# Patient Record
Sex: Female | Born: 1960 | Race: White | Hispanic: No | Marital: Married | Smoking: Former smoker
Health system: Southern US, Community
[De-identification: ages and names within clinical notes are randomized; demographics above are authoritative.]

## PROBLEM LIST (undated history)

## (undated) DIAGNOSIS — Z8719 Personal history of other diseases of the digestive system: Secondary | ICD-10-CM

## (undated) DIAGNOSIS — G5603 Carpal tunnel syndrome, bilateral upper limbs: Secondary | ICD-10-CM

## (undated) DIAGNOSIS — R0600 Dyspnea, unspecified: Secondary | ICD-10-CM

## (undated) DIAGNOSIS — J45909 Unspecified asthma, uncomplicated: Secondary | ICD-10-CM

## (undated) DIAGNOSIS — G4730 Sleep apnea, unspecified: Secondary | ICD-10-CM

## (undated) DIAGNOSIS — G629 Polyneuropathy, unspecified: Secondary | ICD-10-CM

## (undated) DIAGNOSIS — K219 Gastro-esophageal reflux disease without esophagitis: Secondary | ICD-10-CM

## (undated) DIAGNOSIS — M1990 Unspecified osteoarthritis, unspecified site: Secondary | ICD-10-CM

## (undated) DIAGNOSIS — N63 Unspecified lump in unspecified breast: Secondary | ICD-10-CM

## (undated) DIAGNOSIS — I10 Essential (primary) hypertension: Secondary | ICD-10-CM

## (undated) HISTORY — PX: WISDOM TOOTH EXTRACTION: SHX21

## (undated) HISTORY — PX: OTHER SURGICAL HISTORY: SHX169

## (undated) HISTORY — PX: CARPAL TUNNEL RELEASE: SHX101

## (undated) HISTORY — PX: COLONOSCOPY W/ POLYPECTOMY: SHX1380

## (undated) HISTORY — PX: ABDOMINAL HYSTERECTOMY: SHX81

---

## 2010-09-18 HISTORY — PX: INCONTINENCE SURGERY: SHX676

## 2012-03-01 HISTORY — PX: BREAST EXCISIONAL BIOPSY: SUR124

## 2012-03-01 HISTORY — PX: BREAST SURGERY: SHX581

## 2014-11-02 ENCOUNTER — Ambulatory Visit: Payer: Self-pay | Admitting: Gastroenterology

## 2015-01-11 LAB — SURGICAL PATHOLOGY

## 2016-04-11 ENCOUNTER — Inpatient Hospital Stay
Admission: RE | Admit: 2016-04-11 | Discharge: 2016-04-11 | Disposition: A | Discharge status: Home / Self Care | Payer: Self-pay | Source: Ambulatory Visit | Attending: *Deleted | Admitting: *Deleted

## 2016-04-11 ENCOUNTER — Other Ambulatory Visit: Payer: Self-pay | Admitting: *Deleted

## 2016-04-11 ENCOUNTER — Other Ambulatory Visit: Payer: Self-pay | Admitting: Family Medicine

## 2016-04-11 DIAGNOSIS — Z9289 Personal history of other medical treatment: Secondary | ICD-10-CM

## 2016-04-13 ENCOUNTER — Inpatient Hospital Stay
Admission: RE | Admit: 2016-04-13 | Discharge: 2016-04-13 | Disposition: A | Discharge status: Home / Self Care | Payer: Self-pay | Source: Ambulatory Visit | Attending: *Deleted | Admitting: *Deleted

## 2016-04-13 ENCOUNTER — Other Ambulatory Visit: Payer: Self-pay | Admitting: *Deleted

## 2016-04-13 DIAGNOSIS — Z9289 Personal history of other medical treatment: Secondary | ICD-10-CM

## 2016-04-18 ENCOUNTER — Other Ambulatory Visit: Payer: Self-pay | Admitting: Obstetrics and Gynecology

## 2016-04-18 DIAGNOSIS — N63 Unspecified lump in unspecified breast: Secondary | ICD-10-CM

## 2016-05-03 ENCOUNTER — Ambulatory Visit
Admission: RE | Admit: 2016-05-03 | Discharge: 2016-05-03 | Disposition: A | Discharge status: Home / Self Care | Payer: BLUE CROSS/BLUE SHIELD | Source: Ambulatory Visit | Attending: Obstetrics and Gynecology | Admitting: Obstetrics and Gynecology

## 2016-05-03 ENCOUNTER — Other Ambulatory Visit: Payer: Self-pay | Admitting: Obstetrics and Gynecology

## 2016-05-03 DIAGNOSIS — N63 Unspecified lump in unspecified breast: Secondary | ICD-10-CM

## 2016-05-03 HISTORY — DX: Unspecified lump in unspecified breast: N63.0

## 2016-05-08 ENCOUNTER — Ambulatory Visit: Payer: BLUE CROSS/BLUE SHIELD

## 2016-07-05 ENCOUNTER — Encounter
Admission: RE | Admit: 2016-07-05 | Discharge: 2016-07-05 | Disposition: A | Discharge status: Home / Self Care | Payer: BLUE CROSS/BLUE SHIELD | Source: Ambulatory Visit | Attending: Orthopedic Surgery | Admitting: Orthopedic Surgery

## 2016-07-05 DIAGNOSIS — I10 Essential (primary) hypertension: Secondary | ICD-10-CM | POA: Insufficient documentation

## 2016-07-05 DIAGNOSIS — Z0183 Encounter for blood typing: Secondary | ICD-10-CM | POA: Insufficient documentation

## 2016-07-05 DIAGNOSIS — Z01812 Encounter for preprocedural laboratory examination: Secondary | ICD-10-CM | POA: Diagnosis not present

## 2016-07-05 DIAGNOSIS — M1611 Unilateral primary osteoarthritis, right hip: Secondary | ICD-10-CM | POA: Insufficient documentation

## 2016-07-05 DIAGNOSIS — Z01818 Encounter for other preprocedural examination: Secondary | ICD-10-CM | POA: Diagnosis present

## 2016-07-05 DIAGNOSIS — Z888 Allergy status to other drugs, medicaments and biological substances status: Secondary | ICD-10-CM | POA: Insufficient documentation

## 2016-07-05 HISTORY — DX: Essential (primary) hypertension: I10

## 2016-07-05 HISTORY — DX: Unspecified osteoarthritis, unspecified site: M19.90

## 2016-07-05 HISTORY — DX: Dyspnea, unspecified: R06.00

## 2016-07-05 HISTORY — DX: Polyneuropathy, unspecified: G62.9

## 2016-07-05 HISTORY — DX: Carpal tunnel syndrome, bilateral upper limbs: G56.03

## 2016-07-05 HISTORY — DX: Personal history of other diseases of the digestive system: Z87.19

## 2016-07-05 HISTORY — DX: Gastro-esophageal reflux disease without esophagitis: K21.9

## 2016-07-05 LAB — BASIC METABOLIC PANEL
Anion gap: 8 (ref 5–15)
BUN: 12 mg/dL (ref 6–20)
CHLORIDE: 103 mmol/L (ref 101–111)
CO2: 29 mmol/L (ref 22–32)
CREATININE: 0.89 mg/dL (ref 0.44–1.00)
Calcium: 9.2 mg/dL (ref 8.9–10.3)
GFR calc Af Amer: >60 mL/min (ref >60)
GLUCOSE: 93 mg/dL (ref 65–99)
POTASSIUM: 3.8 mmol/L (ref 3.5–5.1)
Sodium: 140 mmol/L (ref 135–145)

## 2016-07-05 LAB — PROTIME-INR
INR: 0.92
Prothrombin Time: 12.3 seconds (ref 11.4–15.2)

## 2016-07-05 LAB — URINALYSIS COMPLETE WITH MICROSCOPIC (ARMC ONLY)
BILIRUBIN URINE: NEGATIVE
Bacteria, UA: NONE SEEN
GLUCOSE, UA: NEGATIVE mg/dL
Hgb urine dipstick: NEGATIVE
Ketones, ur: NEGATIVE mg/dL
Leukocytes, UA: NEGATIVE
Nitrite: NEGATIVE
Protein, ur: NEGATIVE mg/dL
Specific Gravity, Urine: 1.009 (ref 1.005–1.030)
pH: 6 (ref 5.0–8.0)

## 2016-07-05 LAB — CBC
HCT: 41 % (ref 35.0–47.0)
Hemoglobin: 14.4 g/dL (ref 12.0–16.0)
MCH: 31.2 pg (ref 26.0–34.0)
MCHC: 35 g/dL (ref 32.0–36.0)
MCV: 89.2 fL (ref 80.0–100.0)
PLATELETS: 212 10*3/uL (ref 150–440)
RBC: 4.6 MIL/uL (ref 3.80–5.20)
RDW: 13.1 % (ref 11.5–14.5)
WBC: 5.7 10*3/uL (ref 3.6–11.0)

## 2016-07-05 LAB — TYPE AND SCREEN
ABO/RH(D): O NEG
ANTIBODY SCREEN: NEGATIVE

## 2016-07-05 LAB — SEDIMENTATION RATE: SED RATE: 15 mm/h (ref 0–30)

## 2016-07-05 LAB — SURGICAL PCR SCREEN
MRSA, PCR: NEGATIVE
Staphylococcus aureus: NEGATIVE

## 2016-07-05 LAB — APTT: aPTT: 37 seconds — ABNORMAL HIGH (ref 24–36)

## 2016-07-05 NOTE — Patient Instructions (Signed)
  Your procedure is scheduled on: July 18, 2016 (Tuesday) Report to [facility] Day [facility] 2nd floor [facility] To find out your arrival time please call [phone] between 1PM - 3PM on July 17, 2016 (Monday)   Remember: Instructions that are not followed completely may result in serious medical risk, up to and including death, or upon the discretion of your surgeon and anesthesiologist your surgery may need to be rescheduled.    _x___ 1. Do not eat food or drink liquids after midnight. No gum chewing or hard candies.     _x     __ 2. No Alcohol for 24 hours before or after surgery.   __x__3. No Smoking for 24 prior to surgery.   ____  4. Bring all medications with you on the day of surgery if instructed.    __x__ 5. Notify your doctor if there is any change in your medical condition     (cold, fever, infections).     Do not wear jewelry, make-up, hairpins, clips or nail polish.  Do not wear lotions, powders, or perfumes. You may wear deodorant.  Do not shave 48 hours prior to surgery. Men may shave face and neck.  Do not bring valuables to the hospital.    [facility] is not responsible for any belongings or valuables.               Contacts, dentures or bridgework may not be worn into surgery.  Leave your suitcase in the car. After surgery it may be brought to your room.  For patients admitted to the hospital, discharge time is determined by your treatment team.   Patients discharged the day of surgery will not be allowed to drive home.    Please read over the following fact sheets that you were given:   [facility] Preparing for Surgery and or MRSA Information   _x___ Take these medicines the morning of surgery with A SIP OF WATER:    1. Omeprazole  2.  3.  4.  5.  6.  ____Fleets enema or Magnesium Citrate as directed.   _x___ Use CHG Soap or sage wipes as directed on instruction sheet   ____ Use inhalers on the day of surgery and bring to hospital day of  surgery  ____ Stop metformin 2 days prior to surgery    ____ Take 1/2 of usual insulin dose the night before surgery and none on the morning of           surgery.   _x___ Stop aspirin or coumadin, or plavix (NO ASPIRIN)  x__ Stop Anti-inflammatories such as Advil, Aleve, Ibuprofen, Motrin, Naproxen,          Naprosyn, Goodies powders or aspirin products.  (STOP MELOXICAM ONE WEEK PRIOR TO SURGERY) Ok to take Tylenol.   ____ Stop supplements until after surgery.    ____ Bring C-Pap to the hospital.

## 2016-07-06 LAB — URINE CULTURE

## 2016-07-18 ENCOUNTER — Encounter: Payer: Self-pay | Admitting: *Deleted

## 2016-07-18 ENCOUNTER — Inpatient Hospital Stay: Payer: BLUE CROSS/BLUE SHIELD

## 2016-07-18 ENCOUNTER — Inpatient Hospital Stay
Admission: RE | Admit: 2016-07-18 | Discharge: 2016-07-21 | DRG: 470 | Disposition: A | Discharge status: Home Health | Payer: BLUE CROSS/BLUE SHIELD | Source: Ambulatory Visit | Attending: Orthopedic Surgery | Admitting: Orthopedic Surgery

## 2016-07-18 ENCOUNTER — Inpatient Hospital Stay: Payer: BLUE CROSS/BLUE SHIELD | Admitting: Anesthesiology

## 2016-07-18 ENCOUNTER — Encounter
Admission: RE | Disposition: A | Discharge status: Home Health | Payer: Self-pay | Source: Ambulatory Visit | Attending: Orthopedic Surgery

## 2016-07-18 DIAGNOSIS — M25551 Pain in right hip: Secondary | ICD-10-CM

## 2016-07-18 DIAGNOSIS — R262 Difficulty in walking, not elsewhere classified: Secondary | ICD-10-CM

## 2016-07-18 DIAGNOSIS — G8918 Other acute postprocedural pain: Secondary | ICD-10-CM

## 2016-07-18 DIAGNOSIS — M6281 Muscle weakness (generalized): Secondary | ICD-10-CM

## 2016-07-18 DIAGNOSIS — K219 Gastro-esophageal reflux disease without esophagitis: Secondary | ICD-10-CM | POA: Diagnosis present

## 2016-07-18 DIAGNOSIS — Z79899 Other long term (current) drug therapy: Secondary | ICD-10-CM

## 2016-07-18 DIAGNOSIS — I10 Essential (primary) hypertension: Secondary | ICD-10-CM | POA: Diagnosis present

## 2016-07-18 DIAGNOSIS — K449 Diaphragmatic hernia without obstruction or gangrene: Secondary | ICD-10-CM | POA: Diagnosis present

## 2016-07-18 DIAGNOSIS — Z888 Allergy status to other drugs, medicaments and biological substances status: Secondary | ICD-10-CM

## 2016-07-18 DIAGNOSIS — Z419 Encounter for procedure for purposes other than remedying health state, unspecified: Secondary | ICD-10-CM

## 2016-07-18 DIAGNOSIS — M1611 Unilateral primary osteoarthritis, right hip: Secondary | ICD-10-CM | POA: Diagnosis present

## 2016-07-18 HISTORY — PX: TOTAL HIP ARTHROPLASTY: SHX124

## 2016-07-18 LAB — CREATININE, SERUM: Creatinine, Ser: 0.81 mg/dL (ref 0.44–1.00)

## 2016-07-18 LAB — CBC
HEMATOCRIT: 40.2 % (ref 35.0–47.0)
Hemoglobin: 13.7 g/dL (ref 12.0–16.0)
MCH: 30.7 pg (ref 26.0–34.0)
MCHC: 34.2 g/dL (ref 32.0–36.0)
MCV: 89.7 fL (ref 80.0–100.0)
PLATELETS: 208 10*3/uL (ref 150–440)
RBC: 4.48 MIL/uL (ref 3.80–5.20)
RDW: 13.1 % (ref 11.5–14.5)
WBC: 13.8 10*3/uL — AB (ref 3.6–11.0)

## 2016-07-18 LAB — ABO/RH: ABO/RH(D): O NEG

## 2016-07-18 SURGERY — ARTHROPLASTY, HIP, TOTAL, ANTERIOR APPROACH
Anesthesia: Spinal | Site: Hip | Laterality: Right | Wound class: Clean

## 2016-07-18 MED ORDER — HYDROMORPHONE HCL 1 MG/ML IJ SOLN
0.2500 mg | INTRAMUSCULAR | Status: DC | PRN
  Administered 2016-07-18 (×4): 0.5 mg via INTRAVENOUS

## 2016-07-18 MED ORDER — FENTANYL CITRATE (PF) 100 MCG/2ML IJ SOLN
INTRAMUSCULAR | Status: AC
  Filled 2016-07-18: qty 2

## 2016-07-18 MED ORDER — ONDANSETRON HCL 4 MG/2ML IJ SOLN
INTRAMUSCULAR | Status: AC
  Filled 2016-07-18: qty 2

## 2016-07-18 MED ORDER — MELOXICAM 7.5 MG PO TABS
15.0000 mg | ORAL_TABLET | Freq: Every day | ORAL | Status: DC
  Administered 2016-07-19 – 2016-07-21 (×3): 15 mg via ORAL
  Filled 2016-07-18 (×4): qty 2

## 2016-07-18 MED ORDER — METOCLOPRAMIDE HCL 5 MG/ML IJ SOLN
5.0000 mg | Freq: Three times a day (TID) | INTRAMUSCULAR | Status: DC | PRN
  Administered 2016-07-19: 10 mg via INTRAVENOUS
  Filled 2016-07-18 (×2): qty 2

## 2016-07-18 MED ORDER — CEFAZOLIN SODIUM-DEXTROSE 2-4 GM/100ML-% IV SOLN
2.0000 g | Freq: Once | INTRAVENOUS | Status: AC
  Administered 2016-07-18: 2 g via INTRAVENOUS

## 2016-07-18 MED ORDER — DEXTROSE 5 % IV SOLN
3.0000 g | Freq: Four times a day (QID) | INTRAVENOUS | Status: AC
  Administered 2016-07-18 – 2016-07-19 (×2): 3 g via INTRAVENOUS
  Filled 2016-07-18 (×4): qty 3000

## 2016-07-18 MED ORDER — FENTANYL CITRATE (PF) 100 MCG/2ML IJ SOLN
INTRAMUSCULAR | Status: DC | PRN
  Administered 2016-07-18 (×2): 25 ug via INTRAVENOUS
  Administered 2016-07-18 (×3): 50 ug via INTRAVENOUS

## 2016-07-18 MED ORDER — MORPHINE SULFATE (PF) 10 MG/ML IV SOLN
INTRAVENOUS | Status: AC
  Administered 2016-07-18: 2 mg via INTRAMUSCULAR
  Filled 2016-07-18: qty 1

## 2016-07-18 MED ORDER — ENOXAPARIN SODIUM 40 MG/0.4ML ~~LOC~~ SOLN
40.0000 mg | SUBCUTANEOUS | Status: DC
  Administered 2016-07-19 – 2016-07-21 (×3): 40 mg via SUBCUTANEOUS
  Filled 2016-07-18 (×3): qty 0.4

## 2016-07-18 MED ORDER — NEOMYCIN-POLYMYXIN B GU 40-200000 IR SOLN
Status: DC | PRN
  Administered 2016-07-18: 4 mL

## 2016-07-18 MED ORDER — ACETAMINOPHEN 650 MG RE SUPP: 650.0000 mg | Freq: Four times a day (QID) | RECTAL | Status: DC | PRN

## 2016-07-18 MED ORDER — GABAPENTIN 300 MG PO CAPS
600.0000 mg | ORAL_CAPSULE | Freq: Every day | ORAL | Status: DC
  Administered 2016-07-18 – 2016-07-20 (×3): 600 mg via ORAL
  Filled 2016-07-18 (×3): qty 2

## 2016-07-18 MED ORDER — LACTATED RINGERS IV SOLN
INTRAVENOUS | Status: DC
  Administered 2016-07-18 (×2): via INTRAVENOUS

## 2016-07-18 MED ORDER — ACETAMINOPHEN 325 MG PO TABS
650.0000 mg | ORAL_TABLET | Freq: Four times a day (QID) | ORAL | Status: DC | PRN
  Administered 2016-07-20 – 2016-07-21 (×2): 650 mg via ORAL
  Filled 2016-07-18: qty 2

## 2016-07-18 MED ORDER — MORPHINE SULFATE (PF) 2 MG/ML IV SOLN
2.0000 mg | INTRAVENOUS | Status: DC | PRN
  Administered 2016-07-18 (×2): 2 mg via INTRAVENOUS
  Filled 2016-07-18 (×2): qty 1

## 2016-07-18 MED ORDER — BUPIVACAINE-EPINEPHRINE (PF) 0.25% -1:200000 IJ SOLN
INTRAMUSCULAR | Status: AC
  Filled 2016-07-18: qty 30

## 2016-07-18 MED ORDER — TIZANIDINE HCL 4 MG PO TABS
4.0000 mg | ORAL_TABLET | Freq: Every day | ORAL | Status: DC
  Administered 2016-07-18 – 2016-07-20 (×3): 4 mg via ORAL
  Filled 2016-07-18 (×3): qty 1

## 2016-07-18 MED ORDER — OXYCODONE HCL 5 MG PO TABS
5.0000 mg | ORAL_TABLET | ORAL | Status: DC | PRN
  Administered 2016-07-18: 5 mg via ORAL
  Administered 2016-07-18 – 2016-07-19 (×3): 10 mg via ORAL
  Administered 2016-07-19: 5 mg via ORAL
  Filled 2016-07-18 (×2): qty 1
  Filled 2016-07-18 (×3): qty 2

## 2016-07-18 MED ORDER — ONDANSETRON HCL 4 MG/2ML IJ SOLN
4.0000 mg | Freq: Four times a day (QID) | INTRAMUSCULAR | Status: DC | PRN
  Administered 2016-07-18: 4 mg via INTRAVENOUS
  Filled 2016-07-18: qty 2

## 2016-07-18 MED ORDER — ONDANSETRON HCL 4 MG PO TABS: 4.0000 mg | ORAL_TABLET | Freq: Four times a day (QID) | ORAL | Status: DC | PRN

## 2016-07-18 MED ORDER — PROPOFOL 500 MG/50ML IV EMUL
INTRAVENOUS | Status: DC | PRN
  Administered 2016-07-18: 65 ug/kg/min via INTRAVENOUS

## 2016-07-18 MED ORDER — ALUM & MAG HYDROXIDE-SIMETH 200-200-20 MG/5ML PO SUSP
30.0000 mL | ORAL | Status: DC | PRN
  Administered 2016-07-18: 30 mL via ORAL
  Filled 2016-07-18: qty 30

## 2016-07-18 MED ORDER — MAGNESIUM CITRATE PO SOLN
1.0000 | Freq: Once | ORAL | Status: DC | PRN
  Filled 2016-07-18: qty 296

## 2016-07-18 MED ORDER — ZOLPIDEM TARTRATE 5 MG PO TABS: 5.0000 mg | ORAL_TABLET | Freq: Every evening | ORAL | Status: DC | PRN

## 2016-07-18 MED ORDER — DEXTROSE 5 % IV SOLN
500.0000 mg | Freq: Four times a day (QID) | INTRAVENOUS | Status: DC | PRN
  Filled 2016-07-18: qty 5

## 2016-07-18 MED ORDER — IRBESARTAN 150 MG PO TABS
150.0000 mg | ORAL_TABLET | Freq: Every day | ORAL | Status: DC
  Administered 2016-07-19 – 2016-07-21 (×3): 150 mg via ORAL
  Filled 2016-07-18 (×3): qty 1

## 2016-07-18 MED ORDER — SODIUM CHLORIDE 0.9 % IV SOLN
INTRAVENOUS | Status: DC | PRN
  Administered 2016-07-18: 30 ug/min via INTRAVENOUS

## 2016-07-18 MED ORDER — HYDROCHLOROTHIAZIDE 25 MG PO TABS
25.0000 mg | ORAL_TABLET | Freq: Every day | ORAL | Status: DC
  Administered 2016-07-19 – 2016-07-21 (×3): 25 mg via ORAL
  Filled 2016-07-18 (×3): qty 1

## 2016-07-18 MED ORDER — PROPOFOL 10 MG/ML IV BOLUS
INTRAVENOUS | Status: DC | PRN
  Administered 2016-07-18: 20 mg via INTRAVENOUS
  Administered 2016-07-18 (×3): 10 mg via INTRAVENOUS

## 2016-07-18 MED ORDER — METOCLOPRAMIDE HCL 10 MG PO TABS
5.0000 mg | ORAL_TABLET | Freq: Three times a day (TID) | ORAL | Status: DC | PRN
Start: 2016-07-18 — End: 2016-07-21
  Filled 2016-07-18: qty 2

## 2016-07-18 MED ORDER — BISACODYL 10 MG RE SUPP: 10.0000 mg | Freq: Every day | RECTAL | Status: DC | PRN

## 2016-07-18 MED ORDER — SODIUM CHLORIDE 0.9 % IV SOLN
INTRAVENOUS | Status: DC
  Administered 2016-07-18 – 2016-07-19 (×3): via INTRAVENOUS

## 2016-07-18 MED ORDER — PHENOL 1.4 % MT LIQD
1.0000 | OROMUCOSAL | Status: DC | PRN
  Filled 2016-07-18: qty 177

## 2016-07-18 MED ORDER — ACETAMINOPHEN 10 MG/ML IV SOLN
INTRAVENOUS | Status: DC | PRN
  Administered 2016-07-18: 1000 mg via INTRAVENOUS

## 2016-07-18 MED ORDER — HYDROMORPHONE HCL 1 MG/ML IJ SOLN
INTRAMUSCULAR | Status: AC
  Filled 2016-07-18: qty 1

## 2016-07-18 MED ORDER — FENTANYL CITRATE (PF) 100 MCG/2ML IJ SOLN
25.0000 ug | INTRAMUSCULAR | Status: AC | PRN
  Administered 2016-07-18 (×6): 25 ug via INTRAVENOUS

## 2016-07-18 MED ORDER — MAGNESIUM HYDROXIDE 400 MG/5ML PO SUSP: 30.0000 mL | Freq: Every day | ORAL | Status: DC | PRN

## 2016-07-18 MED ORDER — BUPIVACAINE-EPINEPHRINE 0.25% -1:200000 IJ SOLN
INTRAMUSCULAR | Status: DC | PRN
  Administered 2016-07-18: 30 mL

## 2016-07-18 MED ORDER — METHOCARBAMOL 500 MG PO TABS: 500.0000 mg | ORAL_TABLET | Freq: Four times a day (QID) | ORAL | Status: DC | PRN

## 2016-07-18 MED ORDER — BUPIVACAINE IN DEXTROSE 0.75-8.25 % IT SOLN
INTRATHECAL | Status: DC | PRN
  Administered 2016-07-18: 1.6 mL via INTRATHECAL

## 2016-07-18 MED ORDER — ACETAMINOPHEN 10 MG/ML IV SOLN
INTRAVENOUS | Status: AC
  Filled 2016-07-18: qty 100

## 2016-07-18 MED ORDER — VALSARTAN-HYDROCHLOROTHIAZIDE 160-25 MG PO TABS: 1.0000 | ORAL_TABLET | Freq: Every day | ORAL | Status: DC

## 2016-07-18 MED ORDER — ONDANSETRON HCL 4 MG/2ML IJ SOLN
4.0000 mg | Freq: Once | INTRAMUSCULAR | Status: AC | PRN
  Administered 2016-07-18: 4 mg via INTRAVENOUS

## 2016-07-18 MED ORDER — NEOMYCIN-POLYMYXIN B GU 40-200000 IR SOLN
Status: AC
  Filled 2016-07-18: qty 4

## 2016-07-18 MED ORDER — FLUTICASONE PROPIONATE 50 MCG/ACT NA SUSP
2.0000 | Freq: Every day | NASAL | Status: DC
  Administered 2016-07-20 – 2016-07-21 (×2): 2 via NASAL
  Filled 2016-07-18: qty 16

## 2016-07-18 MED ORDER — MORPHINE SULFATE (PF) 2 MG/ML IV SOLN
1.0000 mg | INTRAVENOUS | Status: DC | PRN
  Administered 2016-07-18 (×5): 2 mg via INTRAVENOUS

## 2016-07-18 MED ORDER — SODIUM CHLORIDE 0.9 % IV SOLN
1500.0000 mg | INTRAVENOUS | Status: AC
  Administered 2016-07-18: 1500 mg via INTRAVENOUS
  Filled 2016-07-18 (×2): qty 15

## 2016-07-18 MED ORDER — PANTOPRAZOLE SODIUM 40 MG PO TBEC
40.0000 mg | DELAYED_RELEASE_TABLET | Freq: Every day | ORAL | Status: DC
  Administered 2016-07-19 – 2016-07-21 (×3): 40 mg via ORAL
  Filled 2016-07-18 (×3): qty 1

## 2016-07-18 MED ORDER — ONDANSETRON HCL 4 MG/2ML IJ SOLN
INTRAMUSCULAR | Status: DC | PRN
  Administered 2016-07-18: 4 mg via INTRAVENOUS

## 2016-07-18 MED ORDER — PHENYLEPHRINE HCL 10 MG/ML IJ SOLN
INTRAMUSCULAR | Status: DC | PRN
  Administered 2016-07-18: 100 ug via INTRAVENOUS
  Administered 2016-07-18: 150 ug via INTRAVENOUS
  Administered 2016-07-18 (×3): 100 ug via INTRAVENOUS
  Administered 2016-07-18: 50 ug via INTRAVENOUS

## 2016-07-18 MED ORDER — DOCUSATE SODIUM 100 MG PO CAPS
100.0000 mg | ORAL_CAPSULE | Freq: Two times a day (BID) | ORAL | Status: DC
  Administered 2016-07-18 – 2016-07-21 (×6): 100 mg via ORAL
  Filled 2016-07-18 (×6): qty 1

## 2016-07-18 MED ORDER — MENTHOL 3 MG MT LOZG
1.0000 | LOZENGE | OROMUCOSAL | Status: DC | PRN
  Filled 2016-07-18: qty 9

## 2016-07-18 SURGICAL SUPPLY — 54 items
BLADE SAW SAG 18.5X105 (BLADE) ×3 IMPLANT
BNDG COHESIVE 6X5 TAN STRL LF (GAUZE/BANDAGES/DRESSINGS) ×6 IMPLANT
CANISTER SUCT 1200ML W/VALVE (MISCELLANEOUS) ×3 IMPLANT
CAPT HIP TOTAL 3 ×3 IMPLANT
CATH FOL LEG HOLDER (MISCELLANEOUS) ×3 IMPLANT
CATH TRAY METER 16FR LF (MISCELLANEOUS) ×3 IMPLANT
CHLORAPREP W/TINT 26ML (MISCELLANEOUS) ×3 IMPLANT
DRAPE C-ARM XRAY 36X54 (DRAPES) ×3 IMPLANT
DRAPE INCISE IOBAN 66X60 STRL (DRAPES) IMPLANT
DRAPE POUCH INSTRU U-SHP 10X18 (DRAPES) ×3 IMPLANT
DRAPE SHEET LG 3/4 BI-LAMINATE (DRAPES) ×9 IMPLANT
DRAPE STERI IOBAN 125X83 (DRAPES) ×3 IMPLANT
DRAPE TABLE BACK 80X90 (DRAPES) ×3 IMPLANT
DRAPE WOUND VAC 10X15X1CM (MISCELLANEOUS) ×3 IMPLANT
DRSG OPSITE POSTOP 4X8 (GAUZE/BANDAGES/DRESSINGS) IMPLANT
ELECT BLADE 6.5 EXT (BLADE) ×3 IMPLANT
GAUZE SPONGE 4X4 12PLY STRL (GAUZE/BANDAGES/DRESSINGS) IMPLANT
GLOVE BIO SURGEON STRL SZ 6.5 (GLOVE) ×2 IMPLANT
GLOVE BIO SURGEON STRL SZ7.5 (GLOVE) ×9 IMPLANT
GLOVE BIO SURGEONS STRL SZ 6.5 (GLOVE) ×1
GLOVE BIOGEL PI IND STRL 7.0 (GLOVE) ×1 IMPLANT
GLOVE BIOGEL PI IND STRL 9 (GLOVE) ×1 IMPLANT
GLOVE BIOGEL PI INDICATOR 7.0 (GLOVE) ×2
GLOVE BIOGEL PI INDICATOR 9 (GLOVE) ×2
GLOVE SURG SYN 9.0  PF PI (GLOVE) ×4
GLOVE SURG SYN 9.0 PF PI (GLOVE) ×2 IMPLANT
GOWN SRG 2XL LVL 4 RGLN SLV (GOWNS) ×1 IMPLANT
GOWN STRL NON-REIN 2XL LVL4 (GOWNS) ×2
GOWN STRL REUS W/ TWL LRG LVL3 (GOWN DISPOSABLE) ×1 IMPLANT
GOWN STRL REUS W/TWL LRG LVL3 (GOWN DISPOSABLE) ×2
HEMOVAC 400CC 10FR (MISCELLANEOUS) ×3 IMPLANT
HOOD PEEL AWAY FLYTE STAYCOOL (MISCELLANEOUS) ×3 IMPLANT
KIT PREVENA INCISION MGT20CM45 (CANNISTER) ×3 IMPLANT
MAT BLUE FLOOR 46X72 FLO (MISCELLANEOUS) ×3 IMPLANT
NDL SAFETY 18GX1.5 (NEEDLE) ×3 IMPLANT
NEEDLE SPNL 18GX3.5 QUINCKE PK (NEEDLE) ×3 IMPLANT
NS IRRIG 1000ML POUR BTL (IV SOLUTION) ×3 IMPLANT
PACK HIP COMPR (MISCELLANEOUS) ×3 IMPLANT
SOL PREP PVP 2OZ (MISCELLANEOUS) ×3
SOLUTION PREP PVP 2OZ (MISCELLANEOUS) ×1 IMPLANT
SPONGE DRAIN TRACH 4X4 STRL 2S (GAUZE/BANDAGES/DRESSINGS) ×3 IMPLANT
STAPLER SKIN PROX 35W (STAPLE) ×3 IMPLANT
STRAP SAFETY BODY (MISCELLANEOUS) ×3 IMPLANT
SUT DVC 2 QUILL PDO  T11 36X36 (SUTURE) ×2
SUT DVC 2 QUILL PDO T11 36X36 (SUTURE) ×1 IMPLANT
SUT DVC QUILL MONODERM 30X30 (SUTURE) ×3 IMPLANT
SUT SILK 0 (SUTURE) ×2
SUT SILK 0 30XBRD TIE 6 (SUTURE) ×1 IMPLANT
SUT VIC AB 1 CT1 36 (SUTURE) ×3 IMPLANT
SYR 20CC LL (SYRINGE) ×3 IMPLANT
SYR 30ML LL (SYRINGE) ×3 IMPLANT
TAPE MICROFOAM 4IN (TAPE) IMPLANT
TOWEL OR 17X26 4PK STRL BLUE (TOWEL DISPOSABLE) IMPLANT
TUBE KAMVAC SUCTION (TUBING) ×3 IMPLANT

## 2016-07-18 NOTE — H&P (Signed)
Reviewed paper H+P, will be scanned into chart. No changes noted.  

## 2016-07-18 NOTE — Op Note (Signed)
07/18/2016  11:53 AM  PATIENT:  Tammy Griffin  55 y.o. female  PRE-OPERATIVE DIAGNOSIS:  OSTEOARTHRITIS right hip  POST-OPERATIVE DIAGNOSIS:  OSTEOARTHRITIS right hip  PROCEDURE:  Procedure(s): TOTAL HIP ARTHROPLASTY ANTERIOR APPROACH (Right)  SURGEON: Laurene Footman, MD  ASSISTANTS: None  ANESTHESIA:   spinal  EBL:  Total I/O In: 1000 [I.V.:1000] Out: 900 [Urine:300; Blood:600]  BLOOD ADMINISTERED:none  DRAINS: (2) Hemovact drain(s) in the Subcutaneous layer with  Suction Open   LOCAL MEDICATIONS USED:  MARCAINE     SPECIMEN:  Source of Specimen:  Right femoral head  DISPOSITION OF SPECIMEN:  PATHOLOGY  COUNTS:  YES  TOURNIQUET:  * No tourniquets in log *  IMPLANTS: Medacta Amis 1 standard stem with S 28 mm head, 50 mm Mpact cup DM with liner  DICTATION: .Dragon Dictation   The patient was brought to the operating room and after spinal anesthesia was obtained patient was placed on the operative table with the ipsilateral foot into the Medacta attachment, contralateral leg on a well-padded table. C-arm was brought in and preop template x-ray taken. After prepping and draping in usual sterile fashion appropriate patient identification and timeout procedures were completed. Anterior approach to the hip was obtained and centered over the greater trochanter and TFL muscle. The subcutaneous tissue was incised hemostasis being achieved by electrocautery. TFL fascia was incised and the muscle retracted laterally deep retractor placed. The lateral femoral circumflex vessels were identified and ligated. The anterior capsule was exposed and a capsulotomy performed. The neck was identified and a femoral neck cut carried out with a saw. The head was removed without difficulty and showed sclerotic femoral head and acetabulum. Reaming was carried out to 50 mm and a 50 mm cup trial gave appropriate tightness to the acetabular component a 50 mm Mpact  cup was impacted into position. The  leg was then externally rotated and ischiofemoral and pubofemoral releases carried out. The femur was sequentially broached to a size 1, size 1 standard stem with S head trials were placed and the final components chosen. The 1 standard stem was inserted along with a S 28 mm head and 50 mm liner. The hip was reduced and was stable the wound was thoroughly. The deep fascia was closed using a heavy Quill after infiltration of 30 cc of quarter percent Sensorcaine with epinephrine. Subcutaneous drains were then inserted 3-0 V lock suture to close the skin with skin staples and Provena wound VAC applied  PLAN OF CARE: Admit to inpatient

## 2016-07-18 NOTE — Transfer of Care (Signed)
Immediate Anesthesia Transfer of Care Note  Patient: Tammy Griffin  Procedure(s) Performed: Procedure(s): TOTAL HIP ARTHROPLASTY ANTERIOR APPROACH (Right)  Patient Location: PACU  Anesthesia Type:Spinal  Level of Consciousness: awake and alert   Airway & Oxygen Therapy: Patient Spontanous Breathing and Patient connected to face mask oxygen  Post-op Assessment: Report given to RN and Post -op Vital signs reviewed and stable  Post vital signs: Reviewed  Last Vitals:  Vitals:   07/18/16 0828 07/18/16 1152  BP: 136/74 118/72  Pulse: 83 71  Resp: 16 12  Temp: (!) 35.6 C 36.3 C    Last Pain:  Vitals:   07/18/16 0828  TempSrc: Tympanic         Complications: No apparent anesthesia complications

## 2016-07-18 NOTE — NC FL2 (Signed)
[place] LEVEL OF CARE SCREENING TOOL     IDENTIFICATION  Patient Name: Tammy Griffin Birthdate: Feb 16, 1961 Sex: female Admission Date (Current Location): 07/18/2016  [place] and [place] Number:  Engineering geologist and Address:  [facility], [address] [place], [place] [address]      Provider Number: [id number]  Attending Physician Name and Address:  Hessie Knows, MD  Relative Name and Phone Number:       Current Level of Care: Hospital Recommended Level of Care: [place] Prior Approval Number:    Date Approved/Denied:   PASRR Number:   [id number]  Discharge Plan: SNF    Current Diagnoses: Patient Active Problem List   Diagnosis Date Noted  . Primary localized osteoarthritis of right hip 07/18/2016    Orientation RESPIRATION BLADDER Height & Weight     Self, Time, Situation, Place  Normal External catheter Weight:   Height:     BEHAVIORAL SYMPTOMS/MOOD NEUROLOGICAL BOWEL NUTRITION STATUS   (None.)  (None.) Continent Diet (Diet: Clear Liquid)  AMBULATORY STATUS COMMUNICATION OF NEEDS Skin   Extensive Assist Verbally Surgical wounds (Incision: Right Hip)                       Personal Care Assistance Level of Assistance  Bathing, Feeding, Dressing Bathing Assistance: Limited assistance Feeding assistance: Independent Dressing Assistance: Limited assistance     Functional Limitations Info  Sight, Hearing, Speech Sight Info: Adequate Hearing Info: Adequate Speech Info: Adequate    SPECIAL CARE FACTORS FREQUENCY  PT (By licensed PT), OT (By licensed OT)     PT Frequency:  (5) OT Frequency:  (5)            Contractures      Additional Factors Info  Code Status, Allergies Code Status Info:  (Full Code) Allergies Info:  (Valium Diazepam)           Current Medications (07/18/2016):  This is the current hospital active medication list Current Facility-Administered  Medications  Medication Dose Route Frequency Provider Last Rate Last Dose  . 0.9 %  sodium chloride infusion   Intravenous Continuous Hessie Knows, MD 75 mL/hr at 07/18/16 1443    . acetaminophen (TYLENOL) tablet 650 mg  650 mg Oral Q6H PRN Hessie Knows, MD       Or  . acetaminophen (TYLENOL) suppository 650 mg  650 mg Rectal Q6H PRN Hessie Knows, MD      . alum & mag hydroxide-simeth (MAALOX/MYLANTA) 200-200-20 MG/5ML suspension 30 mL  30 mL Oral Q4H PRN Hessie Knows, MD      . bisacodyl (DULCOLAX) suppository 10 mg  10 mg Rectal Daily PRN Hessie Knows, MD      . ceFAZolin (ANCEF) 3 g in dextrose 5 % 50 mL IVPB  3 g Intravenous Q6H Hessie Knows, MD      . docusate sodium (COLACE) capsule 100 mg  100 mg Oral BID Hessie Knows, MD      . Derrill Memo ON 07/19/2016] enoxaparin (LOVENOX) injection 40 mg  40 mg Subcutaneous Q24H Hessie Knows, MD      . fentaNYL (SUBLIMAZE) 100 MCG/2ML injection           . fentaNYL (SUBLIMAZE) 100 MCG/2ML injection           . fluticasone (FLONASE) 50 MCG/ACT nasal spray 2 spray  2 spray Each Nare Daily Hessie Knows, MD      . gabapentin (NEURONTIN) capsule 600 mg  600 mg Oral QHS Hessie Knows, MD      . irbesartan Levy Sjogren) tablet 150 mg  150 mg Oral Daily Hessie Knows, MD       And  . hydrochlorothiazide (HYDRODIURIL) tablet 25 mg  25 mg Oral Daily Hessie Knows, MD      . HYDROmorphone (DILAUDID) 1 MG/ML injection           . HYDROmorphone (DILAUDID) 1 MG/ML injection           . magnesium citrate solution 1 Bottle  1 Bottle Oral Once PRN Hessie Knows, MD      . magnesium hydroxide (MILK OF MAGNESIA) suspension 30 mL  30 mL Oral Daily PRN Hessie Knows, MD      . meloxicam [facility]) tablet 15 mg  15 mg Oral Daily Hessie Knows, MD      . menthol-cetylpyridinium (CEPACOL) lozenge 3 mg  1 lozenge Oral PRN Hessie Knows, MD       Or  . phenol (CHLORASEPTIC) mouth spray 1 spray  1 spray Mouth/Throat PRN Hessie Knows, MD      . methocarbamol (ROBAXIN) tablet 500 mg  500 mg Oral  Q6H PRN Hessie Knows, MD       Or  . methocarbamol (ROBAXIN) 500 mg in dextrose 5 % 50 mL IVPB  500 mg Intravenous Q6H PRN Hessie Knows, MD      . metoCLOPramide (REGLAN) tablet 5-10 mg  5-10 mg Oral Q8H PRN Hessie Knows, MD       Or  . metoCLOPramide (REGLAN) injection 5-10 mg  5-10 mg Intravenous Q8H PRN Hessie Knows, MD      . morphine 2 MG/ML injection 2 mg  2 mg Intravenous Q2H PRN Hessie Knows, MD   2 mg at 07/18/16 1504  . ondansetron (ZOFRAN) 4 MG/2ML injection           . ondansetron (ZOFRAN) tablet 4 mg  4 mg Oral Q6H PRN Hessie Knows, MD       Or  . ondansetron [facility]) injection 4 mg  4 mg Intravenous Q6H PRN Hessie Knows, MD      . oxyCODONE (Oxy IR/ROXICODONE) immediate release tablet 5-10 mg  5-10 mg Oral Q3H PRN Hessie Knows, MD      . Derrill Memo ON 07/19/2016] pantoprazole (PROTONIX) EC tablet 40 mg  40 mg Oral Daily Hessie Knows, MD      . tiZANidine (ZANAFLEX) tablet 4 mg  4 mg Oral QHS Hessie Knows, MD      . zolpidem [facility]) tablet 5 mg  5 mg Oral QHS PRN Hessie Knows, MD         Discharge Medications: Please see discharge summary for a list of discharge medications.  Relevant Imaging Results:  Relevant Lab Results:   Additional Information  (SSN: [id number])  Danie Chandler, Student-Social Work

## 2016-07-18 NOTE — Anesthesia Preprocedure Evaluation (Signed)
Anesthesia Evaluation  Patient identified by MRN, date of birth, ID band Patient awake    Reviewed: Allergy & Precautions, NPO status , Patient's Chart, lab work & pertinent test results, reviewed documented beta blocker date and time   Airway Mallampati: III  TM Distance: >3 FB     Dental  (+) Chipped   Pulmonary shortness of breath, former smoker,           Cardiovascular hypertension, Pt. on medications      Neuro/Psych  Neuromuscular disease    GI/Hepatic hiatal hernia, GERD  Controlled,  Endo/Other  Morbid obesity  Renal/GU      Musculoskeletal  (+) Arthritis ,   Abdominal   Peds  Hematology   Anesthesia Other Findings   Reproductive/Obstetrics                             Anesthesia Physical Anesthesia Plan  ASA: III  Anesthesia Plan: Spinal   Post-op Pain Management:    Induction:   Airway Management Planned:   Additional Equipment:   Intra-op Plan:   Post-operative Plan:   Informed Consent: I have reviewed the patients History and Physical, chart, labs and discussed the procedure including the risks, benefits and alternatives for the proposed anesthesia with the patient or authorized representative who has indicated his/her understanding and acceptance.     Plan Discussed with: CRNA  Anesthesia Plan Comments:         Anesthesia Quick Evaluation

## 2016-07-18 NOTE — Anesthesia Procedure Notes (Signed)
Spinal  Patient location during procedure: OR Staffing Anesthesiologist: Gunnar Bulla Performed: anesthesiologist  Preanesthetic Checklist Completed: patient identified, site marked, surgical consent, pre-op evaluation, timeout performed, IV checked and risks and benefits discussed Spinal Block Patient position: sitting Prep: Betadine Patient monitoring: heart rate, cardiac monitor, continuous pulse ox and blood pressure Approach: midline Location: L3-4 Injection technique: single-shot Needle Needle type: Pencil-Tip  Needle gauge: 25 G Needle length: 9 cm Assessment Sensory level: T10 Additional Notes 0943 marcaine 1.51ml.

## 2016-07-18 NOTE — Evaluation (Signed)
Physical Therapy Evaluation Patient Details Name: Tammy Griffin MRN: [id number] DOB: [birth year] Today's Date: 07/18/2016   History of Present Illness  Pt admitted for R THR.   Clinical Impression  Pt is a pleasant 55 year old female who was admitted for R THR. Pt very lethargic, however agreeable to therapy. Pt motivated to perform therapy. Pt performs bed mobility with min assist, transfers with cga, and ambulation with cga and RW. Pt demonstrates deficits with strength/endurance/mobility. Pt educated on WB status. Good tolerance with limited there-ex. Would benefit from skilled PT to address above deficits and promote optimal return to PLOF. Recommend transition to [place] upon discharge from acute hospitalization.       Follow Up Recommendations Home health PT    Equipment Recommendations  Rolling walker with 5" wheels;3in1 (PT)    Recommendations for Other Services       Precautions / Restrictions Precautions Precautions: Anterior Hip;Fall Precaution Booklet Issued: No Restrictions Weight Bearing Restrictions: Yes RLE Weight Bearing: Weight bearing as tolerated      Mobility  Bed Mobility Overal bed mobility: Needs Assistance Bed Mobility: Supine to Sit     Supine to sit: Min assist     General bed mobility comments: assist for bed mobility. Able to initiate scooting out towards EOB and use railing to assist. Pt able to sit at EOB with supervision.  Transfers Overall transfer level: Needs assistance Equipment used: Rolling walker (2 wheeled) Transfers: Sit to/from Stand Sit to Stand: Min guard         General transfer comment: safe technique using RW. Cues to push from seated surface. Prior to standing, pt became nauseous, subsided after a few minutes  Ambulation/Gait Ambulation/Gait assistance: Min guard Ambulation Distance (Feet): 5 Feet Assistive device: Rolling walker (2 wheeled) Gait Pattern/deviations: Step-to pattern     General Gait Details:  slow guarded ambulation to recliner. Pt able to follow commands and only requires tactile cues for turning RW  Stairs            Wheelchair Mobility    Modified Rankin (Stroke Patients Only)       Balance Overall balance assessment: Needs assistance Sitting-balance support: Feet supported Sitting balance-Leahy Scale: Normal     Standing balance support: Bilateral upper extremity supported Standing balance-Leahy Scale: Good                               Pertinent Vitals/Pain Pain Assessment: Faces Faces Pain Scale: Hurts a little bit Pain Location: R hip Pain Descriptors / Indicators: Operative site guarding Pain Intervention(s): Premedicated before session;Ice applied    Home Living Family/patient expects to be discharged to:: Private residence Living Arrangements: Spouse/significant other Available Help at Discharge: Family;Available 24 hours/day Type of Home: House Home Access: Stairs to enter Entrance Stairs-Rails: Can reach both Entrance Stairs-Number of Steps: 3 Home Layout: One level Home Equipment: None      Prior Function Level of Independence: Independent         Comments: household ambulator     Hand Dominance        Extremity/Trunk Assessment   Upper Extremity Assessment: Overall WFL for tasks assessed           Lower Extremity Assessment:  (R LE grossly 3/5; L LE grossly 5/5)         Communication   Communication: No difficulties  Cognition Arousal/Alertness: Awake/alert Behavior During Therapy: WFL for tasks assessed/performed Overall Cognitive Status: Within  Functional Limits for tasks assessed                      General Comments      Exercises Other Exercises Other Exercises: supine ther-ex performed including B LE ankle pumps, quad sets, glut sets, and hip ab/add. All ther-ex performed x 10 reps with min assist for technique.   Assessment/Plan    PT Assessment Patient needs continued PT  services  PT Problem List Decreased strength;Decreased activity tolerance;Decreased balance;Decreased mobility;Decreased knowledge of use of DME;Pain          PT Treatment Interventions DME instruction;Gait training;Therapeutic exercise    PT Goals (Current goals can be found in the Care Plan section)  Acute Rehab PT Goals Patient Stated Goal: to get stronger PT Goal Formulation: With patient Time For Goal Achievement: 08/01/16 Potential to Achieve Goals: Good    Frequency BID   Barriers to discharge        Co-evaluation               End of Session Equipment Utilized During Treatment: Gait belt Activity Tolerance: Patient tolerated treatment well Patient left: in chair;with chair alarm set Nurse Communication: Mobility status         Time: [id number] PT Time Calculation (min) (ACUTE ONLY): 38 min   Charges:   PT Evaluation $PT Eval Moderate Complexity: 1 Procedure PT Treatments $Therapeutic Exercise: 8-22 mins   PT G Codes:        Karee Christopherson 07-30-2016, 4:41 PM  Greggory Stallion, PT, DPT [phone]

## 2016-07-18 NOTE — Anesthesia Procedure Notes (Signed)
Date/Time: 07/18/2016 9:50 AM Performed by: Johnna Acosta Pre-anesthesia Checklist: Patient identified, Emergency Drugs available, Suction available, Patient being monitored and Timeout performed Patient Re-evaluated:Patient Re-evaluated prior to inductionOxygen Delivery Method: Simple face mask Preoxygenation: Pre-oxygenation with 100% oxygen

## 2016-07-19 MED ORDER — FLUCONAZOLE 100 MG PO TABS
150.0000 mg | ORAL_TABLET | Freq: Every day | ORAL | Status: DC
  Administered 2016-07-19 – 2016-07-21 (×3): 150 mg via ORAL
  Filled 2016-07-19 (×3): qty 2

## 2016-07-19 MED ORDER — METOCLOPRAMIDE HCL 5 MG/ML IJ SOLN
10.0000 mg | Freq: Four times a day (QID) | INTRAMUSCULAR | Status: AC
  Administered 2016-07-19 – 2016-07-20 (×3): 10 mg via INTRAVENOUS
  Filled 2016-07-19 (×2): qty 2

## 2016-07-19 MED ORDER — NYSTATIN 100000 UNIT/GM EX POWD
Freq: Every day | CUTANEOUS | Status: DC
  Administered 2016-07-19 – 2016-07-21 (×3): via TOPICAL
  Filled 2016-07-19: qty 15

## 2016-07-19 NOTE — Progress Notes (Signed)
Physical Therapy Treatment Patient Details Name: Tammy Griffin MRN: [id number] DOB: Apr 05, 1961 Today's Date: 07/19/2016    History of Present Illness Pt. is a 55 y.o. female who was admitted for a right THR.    PT Comments    Pt is making good progress towards goals with increased distance this date. Pt still with antalgic gait, however is able to focus on upward gaze during ambulation along with other deficits. Improved speed noted this session. Good endurance with there-ex. Pt with increased heart burn causing vomiting, RN notified. Will continue to progress as able.  Follow Up Recommendations  Home health PT     Equipment Recommendations  Rolling walker with 5" wheels;3in1 (PT)    Recommendations for Other Services       Precautions / Restrictions Precautions Precautions: Anterior Hip;Fall Precaution Booklet Issued: Yes (comment) Restrictions Weight Bearing Restrictions: Yes RLE Weight Bearing: Weight bearing as tolerated    Mobility  Bed Mobility Overal bed mobility: Needs Assistance Bed Mobility: Sit to Supine       Sit to supine: Min assist   General bed mobility comments: assist for bringing B LE up to bed. Assist for controlled descent back down to bed  Transfers Overall transfer level: Needs assistance Equipment used: Rolling walker (2 wheeled) Transfers: Sit to/from Stand Sit to Stand: Min guard         General transfer comment: Improved technique for upright posture. RW used. Cues for pushing from seated surface.  Ambulation/Gait Ambulation/Gait assistance: Min guard Ambulation Distance (Feet): 65 Feet Assistive device: Rolling walker (2 wheeled) Gait Pattern/deviations: Step-to pattern     General Gait Details: Improved gait speed with ambulation, however pt still has difficulty with toe clearance on R LE requiring manual assist for stepping. Improved with increased distance. Upright posture noted. Pt with inward rotation of R foot, able to  self correct with cues. Increased heartburn noted with ambulation   Stairs            Wheelchair Mobility    Modified Rankin (Stroke Patients Only)       Balance                                    Cognition Arousal/Alertness: Awake/alert Behavior During Therapy: WFL for tasks assessed/performed Overall Cognitive Status: Within Functional Limits for tasks assessed                      Exercises Other Exercises Other Exercises: Seated ther-ex performed including R LE ankle pumps, quad sets, glut sets, hip abd/add, and LAQ. ALl ther-ex performed x 12 reps with cga for assistance. [place] HEP reviewed.    General Comments        Pertinent Vitals/Pain Pain Assessment: 0-10 Pain Score: 3  Pain Location: R hip Pain Descriptors / Indicators: Operative site guarding Pain Intervention(s): Limited activity within patient's tolerance;Ice applied    Home Living Family/patient expects to be discharged to:: Private residence Living Arrangements: Spouse/significant other Available Help at Discharge: Family;Available 24 hours/day Type of Home: House Home Access: Stairs to enter Entrance Stairs-Rails: Can reach both Home Layout: One level Home Equipment: None      Prior Function Level of Independence: Independent          PT Goals (current goals can now be found in the care plan section) Acute Rehab PT Goals Patient Stated Goal: To get stronger PT Goal Formulation: With  patient Time For Goal Achievement: 08/01/16 Potential to Achieve Goals: Good Progress towards PT goals: Progressing toward goals    Frequency    BID      PT Plan Current plan remains appropriate    Co-evaluation             End of Session Equipment Utilized During Treatment: Gait belt Activity Tolerance: Patient tolerated treatment well Patient left: in bed;with bed alarm set     Time: [id number] PT Time Calculation (min) (ACUTE ONLY): 38 min  Charges:   $Gait Training: 23-37 mins $Therapeutic Exercise: 8-22 mins                    G Codes:      Tyreik Delahoussaye 07-23-16, 2:06 PM  Greggory Stallion, PT, DPT [phone]

## 2016-07-19 NOTE — Progress Notes (Signed)
Physical Therapy Treatment Patient Details Name: LASHAN NELLUMS MRN: [id number] DOB: 06/10/1961 Today's Date: 07/19/2016    History of Present Illness Pt admitted for R THR.     PT Comments    Pt is making good progress towards goals with increased ambulation distance noted this session. Pt very motivated to perform therapy, however is lethargic this date. Slight nausea symptoms noted with mobility. Good endurance with there-ex. Slow gait speed in room with antalgic gait.  Follow Up Recommendations  Home health PT     Equipment Recommendations  Rolling walker with 5" wheels;3in1 (PT)    Recommendations for Other Services       Precautions / Restrictions Precautions Precautions: Anterior Hip;Fall Precaution Booklet Issued: No Restrictions Weight Bearing Restrictions: Yes RLE Weight Bearing: Weight bearing as tolerated    Mobility  Bed Mobility Overal bed mobility: Needs Assistance Bed Mobility: Supine to Sit     Supine to sit: Min assist     General bed mobility comments: assist for bed mobility. Min assist for initiating movement of R LE and using B UE on railing to sit upper body up.  Transfers Overall transfer level: Needs assistance Equipment used: Rolling walker (2 wheeled) Transfers: Sit to/from Stand Sit to Stand: Min assist         General transfer comment: assist for sequencing and pushing from seated surface. Pt with slow technique to stand upright. Slight forward leaning noted.. Pt sat back down and RW adjusted. On 2nd attempt, pt able to stand with improved upright posture.  Ambulation/Gait Ambulation/Gait assistance: Min guard Ambulation Distance (Feet): 40 Feet Assistive device: Rolling walker (2 wheeled) Gait Pattern/deviations: Step-to pattern     General Gait Details: slow ambulation with decreased stance time on R LE. Step to gait pattern performed with pt complaining of B UE fatigue with increased ambulation.    Stairs            Wheelchair Mobility    Modified Rankin (Stroke Patients Only)       Balance                                    Cognition Arousal/Alertness: Awake/alert Behavior During Therapy: WFL for tasks assessed/performed Overall Cognitive Status: Within Functional Limits for tasks assessed                      Exercises Other Exercises Other Exercises: supine ther-ex performed including B LE ankle pumps, quad sets, glut sets, SAQ, and hip ab/add. All ther-ex performed x 12 reps with min assist for technique.    General Comments        Pertinent Vitals/Pain Pain Assessment: 0-10 Pain Score: 4  Pain Location: R hip Pain Descriptors / Indicators: Operative site guarding Pain Intervention(s): Limited activity within patient's tolerance;Premedicated before session    Home Living                      Prior Function            PT Goals (current goals can now be found in the care plan section) Acute Rehab PT Goals Patient Stated Goal: to get stronger PT Goal Formulation: With patient Time For Goal Achievement: 08/01/16 Potential to Achieve Goals: Good Progress towards PT goals: Progressing toward goals    Frequency    BID      PT Plan Current plan remains appropriate  Co-evaluation             End of Session Equipment Utilized During Treatment: Gait belt Activity Tolerance: Patient tolerated treatment well Patient left: in chair;with chair alarm set     Time: [phone] PT Time Calculation (min) (ACUTE ONLY): 38 min  Charges:  $Gait Training: 23-37 mins $Therapeutic Exercise: 8-22 mins                    G Codes:      Jaeda Bruso 13-Aug-2016, 10:12 AM Greggory Stallion, PT, DPT [phone]

## 2016-07-19 NOTE — Care Management Note (Signed)
Case Management Note  Patient Details  Name: Tammy Griffin MRN: [id number] Date of Birth: [birth year]  Subjective/Objective:  POD # 1 right THA. Met with patient , spouse and mother in law at bedside. Discussed home health. [facility]ers [facility]. Uses [facility] [phone]). Called Lovenox 40 mg #14, no refills. She will need a walker and BSC. Ordered from [facility]. Patients mother in law and spouse will be her caregivers. Prior to admission she was independent with adls.              Action/Plan: Referral called to [facility] for [facility] PT. [place] called in. DME ordered.   Expected Discharge Date:                  Expected Discharge Plan:  [place]  In-House Referral:     Discharge planning Services  CM Consult  Post Acute Care Choice:  Durable Medical Equipment, Home Health Choice offered to:  Spouse, Patient  DME Arranged:  Bedside commode, Walker rolling DME Agency:  [place]:  PT [place]:  [facility] (now [facility])  Status of Service:  In process, will continue to follow  If discussed at Long Length of Stay Meetings, dates discussed:    Additional Comments:  Jolly Mango, RN 07/19/2016, 10:31 AM

## 2016-07-19 NOTE — Progress Notes (Signed)
   Subjective: 1 Day Post-Op Procedure(s) (LRB): TOTAL HIP ARTHROPLASTY ANTERIOR APPROACH (Right) Patient reports pain as mild.   Patient is well, and has had no acute complaints or problems. Nausea last night, improving this am Denies any CP, SOB, ABD pain. We will continue therapy today.  Plan is to go Home after hospital stay.  Objective: Vital signs in last 24 hours: Temp:  [96.1 F (35.6 C)-99.6 F (37.6 C)] 99.6 F (37.6 C) (11/01 0745) Pulse Rate:  [60-94] 91 (11/01 0745) Resp:  [8-25] 16 (11/01 0745) BP: (118-143)/(55-92) 127/63 (11/01 0745) SpO2:  [87 %-100 %] 87 % (11/01 0745)  Intake/Output from previous day: 10/31 0701 - 11/01 0700 In: 2985 [P.O.:480; I.V.:2505] Out: 1735 [Urine:1080; Drains:55; Blood:600] Intake/Output this shift: No intake/output data recorded.   Recent Labs  07/18/16 1426  HGB 13.7    Recent Labs  07/18/16 1426  WBC 13.8*  RBC 4.48  HCT 40.2  PLT 208    Recent Labs  07/18/16 1426  CREATININE 0.81   No results for input(s): LABPT, INR in the last 72 hours.  EXAM General - Patient is Alert, Appropriate and Oriented Extremity - Neurovascular intact Sensation intact distally Intact pulses distally Dorsiflexion/Plantar flexion intact No cellulitis present Compartment soft Dressing - dressing C/D/I and no drainage. Hemovac and wound vac intact Motor Function - intact, moving foot and toes well on exam.   Past Medical History:  Diagnosis Date  . Arthritis   . Breast mass 2012   Patient states she had a cyst removed from RIGHT Breast  . Carpal tunnel syndrome, bilateral   . Dyspnea    with exertion  . GERD (gastroesophageal reflux disease)   . History of hiatal hernia   . Hypertension   . Neuropathy (HCC)     Assessment/Plan:   1 Day Post-Op Procedure(s) (LRB): TOTAL HIP ARTHROPLASTY ANTERIOR APPROACH (Right) Active Problems:   Primary localized osteoarthritis of right hip  Estimated body mass index is 37.25  kg/m as calculated from the following:   Height as of 07/05/16: 5\' 8"  (1.727 m).   Weight as of 07/05/16: 111.1 kg (245 lb). Advance diet Up with therapy  Needs BM Monitor nausea, continue with zofran/reglan CM to assist with discharge Recheck labs in the am  DVT Prophylaxis - Lovenox, Foot Pumps and TED hose Weight-Bearing as tolerated to right leg   T. Rachelle Hora, PA-C [place] 07/19/2016, 8:14 AM

## 2016-07-19 NOTE — Evaluation (Signed)
Occupational Therapy Evaluation Patient Details Name: Tammy Griffin MRN: [id number] DOB: 02-25-1961 Today's Date: 07/19/2016    History of Present Illness Pt. is a 55 y.o. female who was admitted for a right THR.   Clinical Impression   Pt is 55 year old female s/p right THR(Anterior Approach) who lives at home with her husband. Pt was independent in all ADLs prior to surgery and is eager to return to PLOF.  Pt is currently limited in functional ADLs due to pain, decreased ROM, and lethargy. Pt requires extensive assist for LB ADL skills due to pain and decreased AROM of  LE.  Pt. would benefit from skilled OT services for education in assistive devices, functional mobility, and education in recommendations for home modifications to increase safety and prevent falls.  Pt. Plans to return home with family assist upon discharge.    Follow Up Recommendations  Home health OT    Equipment Recommendations       Recommendations for Other Services PT consult     Precautions / Restrictions Precautions Precautions: Anterior Hip;Fall Precaution Booklet Issued: No Restrictions Weight Bearing Restrictions: Yes RLE Weight Bearing: Weight bearing as tolerated                                                     ADL Overall ADL's : Needs assistance/impaired Eating/Feeding: Set up   Grooming: Set up               Lower Body Dressing: Maximal assistance                 General ADL Comments: Pt. education was provided about A/E use for LE ADLs.     Vision     Perception     Praxis      Pertinent Vitals/Pain Pain Assessment: No/denies pain Pain Score: 6  Pain Location: Right Hip Pain Descriptors / Indicators: Aching Pain Intervention(s): Limited activity within patient's tolerance;Premedicated before session     Hand Dominance Right   Extremity/Trunk Assessment Upper Extremity Assessment Upper Extremity Assessment: Overall WFL for  tasks assessed           Communication Communication Communication: No difficulties   Cognition Arousal/Alertness: Awake/alert Behavior During Therapy: WFL for tasks assessed/performed Overall Cognitive Status: Within Functional Limits for tasks assessed                     General Comments       Exercises   Shoulder Instructions      Home Living Family/patient expects to be discharged to:: Private residence Living Arrangements: Spouse/significant other Available Help at Discharge: Family;Available 24 hours/day Type of Home: House Home Access: Stairs to enter [employer] of Steps: 3 Entrance Stairs-Rails: Can reach both Home Layout: One level     Bathroom Shower/Tub: Tub/shower unit;Curtain Shower/tub characteristics: Architectural technologist: Standard     Home Equipment: None          Prior Functioning/Environment Level of Independence: Independent                 OT Problem List: Decreased strength;Decreased activity tolerance;Decreased knowledge of use of DME or AE;Impaired UE functional use;Pain;Decreased cognition   OT Treatment/Interventions: Self-care/ADL training;Therapeutic exercise;Patient/family education;Therapeutic activities;DME and/or AE instruction;Energy conservation    OT Goals(Current goals can be found in the care plan  section) Acute Rehab OT Goals Patient Stated Goal: To get stronger OT Goal Formulation: With patient Potential to Achieve Goals: Good  OT Frequency: Min 1X/week   Barriers to D/C:            Co-evaluation              End of Session    Activity Tolerance: Patient tolerated treatment well Patient left: with call bell/phone within reach;with chair alarm set;in chair   Time: 0950-1020 OT Time Calculation (min): 30 min Charges:  OT General Charges $OT Visit: 1 Procedure OT Evaluation $OT Eval Moderate Complexity: 1 Procedure OT Treatments $Self Care/Home Management : 8-22  mins G-Codes:    Harrel Carina, MS, OTR/L 07/19/2016, 10:53 AM

## 2016-07-19 NOTE — Anesthesia Postprocedure Evaluation (Signed)
Anesthesia Post Note  Patient: LISBETH CERTO  Procedure(s) Performed: Procedure(s) (LRB): TOTAL HIP ARTHROPLASTY ANTERIOR APPROACH (Right)  Patient location during evaluation: Nursing Unit Anesthesia Type: Spinal Level of consciousness: awake, awake and alert and oriented Pain management: pain level controlled Vital Signs Assessment: post-procedure vital signs reviewed and stable Respiratory status: spontaneous breathing, nonlabored ventilation and respiratory function stable Cardiovascular status: stable Postop Assessment: no headache and no backache Anesthetic complications: no    Last Vitals:  Vitals:   07/19/16 0400 07/19/16 0745  BP: (!) 124/56 127/63  Pulse: 94 91  Resp: 16 16  Temp: 36.9 C 37.6 C    Last Pain:  Vitals:   07/19/16 0745  TempSrc: Oral  PainSc:                  Ricki Miller

## 2016-07-19 NOTE — Care Management (Signed)
Cost of Lovenox is $ 10.00. Spouse updated.

## 2016-07-19 NOTE — Progress Notes (Signed)
Clinical Social Worker (CSW) received SNF consult. PT is recommending home health. RN case manager is aware of above. Please reconsult if future social work needs arise. CSW signing off.   Hester Forget, LCSW [phone] 

## 2016-07-20 LAB — BASIC METABOLIC PANEL
Anion gap: 7 (ref 5–15)
BUN: 13 mg/dL (ref 6–20)
CHLORIDE: 98 mmol/L — AB (ref 101–111)
CO2: 30 mmol/L (ref 22–32)
Calcium: 8.1 mg/dL — ABNORMAL LOW (ref 8.9–10.3)
Creatinine, Ser: 0.8 mg/dL (ref 0.44–1.00)
GFR calc Af Amer: >60 mL/min (ref >60)
GFR calc non Af Amer: >60 mL/min (ref >60)
Glucose, Bld: 152 mg/dL — ABNORMAL HIGH (ref 65–99)
POTASSIUM: 3.1 mmol/L — AB (ref 3.5–5.1)
SODIUM: 135 mmol/L (ref 135–145)

## 2016-07-20 LAB — CBC
HEMATOCRIT: 32 % — AB (ref 35.0–47.0)
HEMOGLOBIN: 11.4 g/dL — AB (ref 12.0–16.0)
MCH: 31.7 pg (ref 26.0–34.0)
MCHC: 35.7 g/dL (ref 32.0–36.0)
MCV: 88.8 fL (ref 80.0–100.0)
Platelets: 176 10*3/uL (ref 150–440)
RBC: 3.61 MIL/uL — AB (ref 3.80–5.20)
RDW: 12.8 % (ref 11.5–14.5)
WBC: 8.6 10*3/uL (ref 3.6–11.0)

## 2016-07-20 LAB — SURGICAL PATHOLOGY

## 2016-07-20 MED ORDER — POTASSIUM CHLORIDE 20 MEQ PO PACK
20.0000 meq | PACK | Freq: Three times a day (TID) | ORAL | Status: AC
  Administered 2016-07-20 (×3): 20 meq via ORAL
  Filled 2016-07-20 (×3): qty 1

## 2016-07-20 NOTE — Progress Notes (Signed)
Physical Therapy Treatment Patient Details Name: Tammy Griffin MRN: [id number] DOB: 1960/11/15 Today's Date: 07/20/2016    History of Present Illness Pt. is a 55 y.o. female who was admitted for a right THR.    PT Comments    Pt is making good progress towards goals with increased ambulation performed this date with improved speed and endurance. No rest breaks required. Pt able to safely perform stair training with safe technique. Reviewed HEP with good endurance with there-ex. Pt safe to perform bathroom tasks with supervision.  Follow Up Recommendations  Home health PT     Equipment Recommendations  Rolling walker with 5" wheels;3in1 (PT)    Recommendations for Other Services       Precautions / Restrictions Precautions Precautions: Anterior Hip;Fall Precaution Booklet Issued: Yes (comment) Restrictions Weight Bearing Restrictions: Yes RLE Weight Bearing: Weight bearing as tolerated    Mobility  Bed Mobility Overal bed mobility: Needs Assistance Bed Mobility: Sit to Supine     Supine to sit: Min guard Sit to supine: Min assist   General bed mobility comments: Assist for bringing legs back onto bed. Assisted pt to turn on side. Pillow between legs. AVI not connected as they are not working, Print production planner.  Transfers Overall transfer level: Needs assistance Equipment used: Rolling walker (2 wheeled) Transfers: Sit to/from Stand Sit to Stand: Supervision         General transfer comment: safe technique with cues to push from seated surface. Pt able to stand with upright posture.  Ambulation/Gait Ambulation/Gait assistance: Supervision Ambulation Distance (Feet): 300 Feet Assistive device: Rolling walker (2 wheeled) Gait Pattern/deviations: Step-through pattern     General Gait Details: reciprocal gait pattern performed with improved gait speed. Pt able to perform heel strike and keep toes aligned in neutral position. No fatigue or LOB  noted   Stairs Stairs: Yes Stairs assistance: Min guard Stair Management: Two rails;Step to pattern;Forwards Number of Stairs: 4 General stair comments: navigated stairs with cues for correct technique. Therapist demonstrated and explained sequencing prior to performance. Safe technique performed  Wheelchair Mobility    Modified Rankin (Stroke Patients Only)       Balance                                    Cognition Arousal/Alertness: Awake/alert Behavior During Therapy: WFL for tasks assessed/performed Overall Cognitive Status: Within Functional Limits for tasks assessed                      Exercises Other Exercises Other Exercises: Supine ther-ex performed x 15 reps on R LE including ankle pumps, quad sets, glut sets, LAQ, SLRs, and hip ab/ad. All ther-ex performed with cga Other Exercises: Pt also ambulated to bathroom with cga and able to perform self hygiene with supervision.    General Comments        Pertinent Vitals/Pain Pain Assessment: 0-10 Pain Score: 3  Pain Location: R hip Pain Descriptors / Indicators: Burning Pain Intervention(s): Limited activity within patient's tolerance    Home Living                      Prior Function            PT Goals (current goals can now be found in the care plan section) Acute Rehab PT Goals Patient Stated Goal: To get stronger PT Goal Formulation: With patient Time  For Goal Achievement: 08/01/16 Potential to Achieve Goals: Good Progress towards PT goals: Progressing toward goals    Frequency    BID      PT Plan Current plan remains appropriate    Co-evaluation             End of Session Equipment Utilized During Treatment: Gait belt Activity Tolerance: Patient tolerated treatment well Patient left: in bed;with bed alarm set     Time: [id number] PT Time Calculation (min) (ACUTE ONLY): 53 min  Charges:  $Gait Training: 23-37 mins $Therapeutic Exercise: 8-22  mins $Therapeutic Activity: 8-22 mins                    G Codes:      Jennah Satchell 2016/08/04, 2:51 PM Greggory Stallion, PT, DPT [phone]

## 2016-07-20 NOTE — Progress Notes (Signed)
Physical Therapy Treatment Patient Details Name: Tammy Griffin MRN: [id number] DOB: 1961-09-09 Today's Date: 07/20/2016    History of Present Illness Pt. is a 55 y.o. female who was admitted for a right THR.    PT Comments    Pt is making good progress towards goals with increased ambulation this session. Improved reciprocal gait pattern with improved speed and pattern. Pt able to perform upright posture and gaze upward instead of at ground. Good tolerance for there-ex. Pt remains motivated to perform therapy.  Follow Up Recommendations  Home health PT     Equipment Recommendations  Rolling walker with 5" wheels;3in1 (PT)    Recommendations for Other Services       Precautions / Restrictions Precautions Precautions: Anterior Hip;Fall Precaution Booklet Issued: Yes (comment) Restrictions Weight Bearing Restrictions: Yes RLE Weight Bearing: Weight bearing as tolerated    Mobility  Bed Mobility Overal bed mobility: Needs Assistance Bed Mobility: Supine to Sit     Supine to sit: Min guard     General bed mobility comments: safe technique performed with HHA. Pt able to sit at EOB with safe technique  Transfers Overall transfer level: Needs assistance Equipment used: Rolling walker (2 wheeled) Transfers: Sit to/from Stand Sit to Stand: Supervision         General transfer comment: safe technique with cues to push from seated surface. Pt able to stand with upright posture.  Ambulation/Gait Ambulation/Gait assistance: Supervision Ambulation Distance (Feet): 200 Feet Assistive device: Rolling walker (2 wheeled) Gait Pattern/deviations: Step-through pattern     General Gait Details: improved reciprocal gait pattern performed with slow gait speed. 1 standing rest break noted.  Improved reciprocal gait pattern with increased toe clearance noted as well as heel strike.   Stairs            Wheelchair Mobility    Modified Rankin (Stroke Patients Only)        Balance                                    Cognition Arousal/Alertness: Awake/alert Behavior During Therapy: WFL for tasks assessed/performed Overall Cognitive Status: Within Functional Limits for tasks assessed                      Exercises Other Exercises Other Exercises: Supine ther-ex performed on R LE including ankle pumps, quad sets, glut sets, SAQs, and hip abd/add,. All ther-ex performed x 12 reps with cga  Other Exercises: Pt also ambulated to bathroom with cga and able to perform self hygiene with supervision.    General Comments        Pertinent Vitals/Pain Pain Assessment: 0-10 Pain Score: 3  Pain Location: R hip Pain Descriptors / Indicators: Burning Pain Intervention(s): Limited activity within patient's tolerance;Repositioned    Home Living                      Prior Function            PT Goals (current goals can now be found in the care plan section) Acute Rehab PT Goals Patient Stated Goal: To get stronger PT Goal Formulation: With patient Time For Goal Achievement: 08/01/16 Potential to Achieve Goals: Good Progress towards PT goals: Progressing toward goals    Frequency    BID      PT Plan Current plan remains appropriate    Co-evaluation  End of Session Equipment Utilized During Treatment: Gait belt Activity Tolerance: Patient tolerated treatment well Patient left: in chair;with chair alarm set     Time: [phone] PT Time Calculation (min) (ACUTE ONLY): 44 min  Charges:  $Gait Training: 23-37 mins $Therapeutic Exercise: 8-22 mins                    G Codes:      Homar Weinkauf 07-27-2016, 1:27 PM  Greggory Stallion, PT, DPT [phone]

## 2016-07-20 NOTE — Progress Notes (Signed)
   Subjective: 2 Days Post-Op Procedure(s) (LRB): TOTAL HIP ARTHROPLASTY ANTERIOR APPROACH (Right) Patient reports pain as mild.   Patient is well, and has had no acute complaints or problems.  Denies any CP, SOB, ABD pain. We will continue therapy today.  Plan is to go Home after hospital stay.  Objective: Vital signs in last 24 hours: Temp:  [98.4 F (36.9 C)-99.8 F (37.7 C)] 99.8 F (37.7 C) (11/02 0729) Pulse Rate:  [88-100] 96 (11/02 0729) Resp:  [16-19] 19 (11/02 0729) BP: (105-114)/(49-61) 112/56 (11/02 0729) SpO2:  [92 %-98 %] 93 % (11/02 0729) Weight:  [116.5 kg (256 lb 12.8 oz)] 116.5 kg (256 lb 12.8 oz) (11/01 1617)  Intake/Output from previous day: 11/01 0701 - 11/02 0700 In: 3166.3 [P.O.:1400; I.V.:1766.3] Out: 1800 [Urine:1800] Intake/Output this shift: No intake/output data recorded.   Recent Labs  07/18/16 1426 07/20/16 0442  HGB 13.7 11.4*    Recent Labs  07/18/16 1426 07/20/16 0442  WBC 13.8* 8.6  RBC 4.48 3.61*  HCT 40.2 32.0*  PLT 208 176    Recent Labs  07/18/16 1426 07/20/16 0442  NA  --  135  K  --  3.1*  CL  --  98*  CO2  --  30  BUN  --  13  CREATININE 0.81 0.80  GLUCOSE  --  152*  CALCIUM  --  8.1*   No results for input(s): LABPT, INR in the last 72 hours.  EXAM General - Patient is Alert, Appropriate and Oriented Extremity - Neurovascular intact Sensation intact distally Intact pulses distally Dorsiflexion/Plantar flexion intact No cellulitis present Compartment soft Dressing - dressing C/D/I and no drainage. Hemovac and wound vac intact. Hemovac removed Motor Function - intact, moving foot and toes well on exam.   Past Medical History:  Diagnosis Date  . Arthritis   . Breast mass 2012   Patient states she had a cyst removed from RIGHT Breast  . Carpal tunnel syndrome, bilateral   . Dyspnea    with exertion  . GERD (gastroesophageal reflux disease)   . History of hiatal hernia   . Hypertension   .  Neuropathy (HCC)     Assessment/Plan:   2 Days Post-Op Procedure(s) (LRB): TOTAL HIP ARTHROPLASTY ANTERIOR APPROACH (Right) Active Problems:   Primary localized osteoarthritis of right hip  Estimated body mass index is 37.92 kg/m as calculated from the following:   Height as of this encounter: 5\' 9"  (1.753 m).   Weight as of this encounter: 116.5 kg (256 lb 12.8 oz). Advance diet Up with therapy  Needs BM Nausea resolved Hypokalemia 3.1, will give oral K Acute post op blood loss anemia hgb 11.4. Stable Recheck labs in the am  DVT Prophylaxis - Lovenox, Foot Pumps and TED hose Weight-Bearing as tolerated to right leg   T. Rachelle Hora, PA-C [place] 07/20/2016, 8:07 AM

## 2016-07-21 LAB — BASIC METABOLIC PANEL
ANION GAP: 5 (ref 5–15)
BUN: 13 mg/dL (ref 6–20)
CO2: 31 mmol/L (ref 22–32)
Calcium: 8.4 mg/dL — ABNORMAL LOW (ref 8.9–10.3)
Chloride: 102 mmol/L (ref 101–111)
Creatinine, Ser: 0.82 mg/dL (ref 0.44–1.00)
GFR calc Af Amer: >60 mL/min (ref >60)
Glucose, Bld: 122 mg/dL — ABNORMAL HIGH (ref 65–99)
POTASSIUM: 3.7 mmol/L (ref 3.5–5.1)
SODIUM: 138 mmol/L (ref 135–145)

## 2016-07-21 LAB — CBC
HCT: 30.4 % — ABNORMAL LOW (ref 35.0–47.0)
Hemoglobin: 10.8 g/dL — ABNORMAL LOW (ref 12.0–16.0)
MCH: 31.7 pg (ref 26.0–34.0)
MCHC: 35.7 g/dL (ref 32.0–36.0)
MCV: 88.7 fL (ref 80.0–100.0)
PLATELETS: 163 10*3/uL (ref 150–440)
RBC: 3.43 MIL/uL — AB (ref 3.80–5.20)
RDW: 12.8 % (ref 11.5–14.5)
WBC: 6.7 10*3/uL (ref 3.6–11.0)

## 2016-07-21 MED ORDER — OXYCODONE HCL 5 MG PO TABS: 5.0000 mg | ORAL_TABLET | ORAL | 0 refills | Status: DC | PRN

## 2016-07-21 MED ORDER — ENOXAPARIN SODIUM 40 MG/0.4ML ~~LOC~~ SOLN: 40.0000 mg | SUBCUTANEOUS | 0 refills | Status: DC

## 2016-07-21 MED ORDER — BISACODYL 10 MG RE SUPP: 10.0000 mg | Freq: Once | RECTAL | Status: DC

## 2016-07-21 NOTE — Discharge Instructions (Signed)

## 2016-07-21 NOTE — Progress Notes (Signed)
Tammy Griffin to be D/C'd Home per MD order.  Discussed with the patient and all questions fully answered.  VSS, Skin clean, dry and intact without evidence of skin break down, no evidence of skin tears noted. IV catheter discontinued intact. Site without signs and symptoms of complications. Dressing and pressure applied.  An After Visit Summary was printed and given to the patient. Patient received prescription.  D/c education completed with patient/family including follow up instructions, medication list, d/c activities limitations if indicated, with other d/c instructions as indicated by MD - patient able to verbalize understanding, all questions fully answered.   Patient instructed to return to ED, call 911, or call MD for any changes in condition.   Patient escorted via [place], and D/C home via private auto.  [name] 07/21/2016 1:43 PM

## 2016-07-21 NOTE — Progress Notes (Signed)
   Subjective: 3 Days Post-Op Procedure(s) (LRB): TOTAL HIP ARTHROPLASTY ANTERIOR APPROACH (Right) Patient reports pain as mild.  Needs BM Patient is well, and has had no acute complaints or problems.  Denies any CP, SOB, ABD pain. We will continue therapy today.  Plan is to go Home after hospital stay.  Objective: Vital signs in last 24 hours: Temp:  [97.9 F (36.6 C)-98.7 F (37.1 C)] 97.9 F (36.6 C) (11/03 0437) Pulse Rate:  [82-101] 82 (11/03 0437) Resp:  [19-20] 19 (11/03 0437) BP: (104-132)/(57-68) 104/57 (11/03 0437) SpO2:  [93 %-99 %] 97 % (11/03 0437)  Intake/Output from previous day: 11/02 0701 - 11/03 0700 In: [id number] [P.O.:710; I.V.:675] Out: 600 [Urine:600] Intake/Output this shift: No intake/output data recorded.   Recent Labs  07/18/16 1426 07/20/16 0442 07/21/16 0346  HGB 13.7 11.4* 10.8*    Recent Labs  07/20/16 0442 07/21/16 0346  WBC 8.6 6.7  RBC 3.61* 3.43*  HCT 32.0* 30.4*  PLT 176 163    Recent Labs  07/20/16 0442 07/21/16 0346  NA 135 138  K 3.1* 3.7  CL 98* 102  CO2 30 31  BUN 13 13  CREATININE 0.80 0.82  GLUCOSE 152* 122*  CALCIUM 8.1* 8.4*   No results for input(s): LABPT, INR in the last 72 hours.  EXAM General - Patient is Alert, Appropriate and Oriented Extremity - Neurovascular intact Sensation intact distally Intact pulses distally Dorsiflexion/Plantar flexion intact No cellulitis present Compartment soft Dressing - dressing C/D/I and no drainage. Hemovac and wound vac intact. Hemovac removed Motor Function - intact, moving foot and toes well on exam.   Past Medical History:  Diagnosis Date  . Arthritis   . Breast mass 2012   Patient states she had a cyst removed from RIGHT Breast  . Carpal tunnel syndrome, bilateral   . Dyspnea    with exertion  . GERD (gastroesophageal reflux disease)   . History of hiatal hernia   . Hypertension   . Neuropathy (HCC)     Assessment/Plan:   3 Days Post-Op  Procedure(s) (LRB): TOTAL HIP ARTHROPLASTY ANTERIOR APPROACH (Right) Active Problems:   Primary localized osteoarthritis of right hip  Estimated body mass index is 37.92 kg/m as calculated from the following:   Height as of this encounter: 5\' 9"  (1.753 m).   Weight as of this encounter: 116.5 kg (256 lb 12.8 oz). Advance diet Up with therapy  Needs BM Plan on discharge to home today pending bowel movement. Follow-up with [facility] orthopedics in 2 weeks.  DVT Prophylaxis - Lovenox, Foot Pumps and TED hose Weight-Bearing as tolerated to right leg   T. Rachelle Hora, PA-C [place] 07/21/2016, 7:56 AM

## 2016-07-21 NOTE — Discharge Summary (Signed)
Physician Discharge Summary  Patient ID: Tammy Griffin MRN: [id number] DOB/AGE: 1960-10-29 55 y.o.  Admit date: 07/18/2016 Discharge date: 07/21/2016  Admission Diagnoses:  OSTEOARTHRITIS   Discharge Diagnoses: Patient Active Problem List   Diagnosis Date Noted  . Primary localized osteoarthritis of right hip 07/18/2016    Past Medical History:  Diagnosis Date  . Arthritis   . Breast mass 2012   Patient states she had a cyst removed from RIGHT Breast  . Carpal tunnel syndrome, bilateral   . Dyspnea    with exertion  . GERD (gastroesophageal reflux disease)   . History of hiatal hernia   . Hypertension   . Neuropathy ([place])      Transfusion: none   Consultants (if any):   Discharged Condition: Improved  Hospital Course: Tammy Griffin is an 55 y.o. female who was admitted 07/18/2016 with a diagnosis of right hip osteoarthritis and went to the operating room on 07/18/2016 and underwent the above named procedures.    Surgeries: Procedure(s): TOTAL HIP ARTHROPLASTY ANTERIOR APPROACH on 07/18/2016 Patient tolerated the surgery well. Taken to PACU where she was stabilized and then transferred to the orthopedic floor.  Started on Lovenox 40 q 12 hrs. Foot pumps applied bilaterally at 80 mm. Heels elevated on bed with rolled towels. No evidence of DVT. Negative Homan. Physical therapy started on day #1 for gait training and transfer. OT started day #1 for ADL and assisted devices.  Patient's foley was d/c on day #1. Patient's IV and hemovac was d/c on day #2.  On post op day #3 patient was stable and ready for discharge to home with hhpt.  Implants: Medacta Amis 1 standard stem with S 28 mm head, 50 mm Mpact cup DM with liner  She was given perioperative antibiotics:  Anti-infectives    Start     Dose/Rate Route Frequency Ordered Stop   07/19/16 1000  fluconazole (DIFLUCAN) tablet 150 mg     150 mg Oral Daily 07/19/16 0816     07/18/16 1600  ceFAZolin (ANCEF)  3 g in dextrose 5 % 50 mL IVPB     3 g 130 mL/hr over 30 Minutes Intravenous Every 6 hours 07/18/16 1416 07/19/16 0959   07/18/16 0115  ceFAZolin (ANCEF) IVPB 2g/100 mL premix     2 g 200 mL/hr over 30 Minutes Intravenous  Once 07/18/16 0111 07/18/16 1015    .  She was given sequential compression devices, early ambulation, and Lovenox  for DVT prophylaxis.  She benefited maximally from the hospital stay and there were no complications.    Recent vital signs:  Vitals:   07/21/16 0437 07/21/16 0759  BP: (!) 104/57 (!) 127/59  Pulse: 82 80  Resp: 19 18  Temp: 97.9 F (36.6 C) 98.1 F (36.7 C)    Recent laboratory studies:  Lab Results  Component Value Date   HGB 10.8 (L) 07/21/2016   HGB 11.4 (L) 07/20/2016   HGB 13.7 07/18/2016   Lab Results  Component Value Date   WBC 6.7 07/21/2016   PLT 163 07/21/2016   Lab Results  Component Value Date   INR 0.92 07/05/2016   Lab Results  Component Value Date   NA 138 07/21/2016   K 3.7 07/21/2016   CL 102 07/21/2016   CO2 31 07/21/2016   BUN 13 07/21/2016   CREATININE 0.82 07/21/2016   GLUCOSE 122 (H) 07/21/2016    Discharge Medications:     Medication List    TAKE these  medications   enoxaparin 40 MG/0.4ML injection Commonly known as:  LOVENOX Inject 0.4 mLs (40 mg total) into the skin daily. Start taking on:  07/22/2016   fluticasone 50 MCG/ACT nasal spray Commonly known as:  FLONASE Place 2 sprays into both nostrils daily.   gabapentin 300 MG capsule Commonly known as:  NEURONTIN Take 600 mg by mouth at bedtime.   meloxicam 15 MG tablet Commonly known as:  MOBIC Take 15 mg by mouth daily.   omeprazole 20 MG capsule Commonly known as:  PRILOSEC Take 20 mg by mouth 2 (two) times daily before a meal.   oxyCODONE 5 MG immediate release tablet Commonly known as:  Oxy IR/ROXICODONE Take 1-2 tablets (5-10 mg total) by mouth every 3 (three) hours as needed for breakthrough pain.   tiZANidine 4 MG  tablet Commonly known as:  ZANAFLEX Take 4 mg by mouth at bedtime.   valsartan-hydrochlorothiazide 160-25 MG tablet Commonly known as:  DIOVAN-HCT Take 1 tablet by mouth daily.       Diagnostic Studies: Dg Hip Operative Unilat W Or W/o Pelvis Right  Result Date: 07/18/2016 CLINICAL DATA:  Intraoperative fluoro spot images from right hip joint replacement. EXAM: OPERATIVE right HIP (WITH PELVIS IF PERFORMED) 2 VIEWS TECHNIQUE: Fluoroscopic spot image(s) were submitted for interpretation post-operatively. COMPARISON:  None in PACs FINDINGS: Fluoroscopy time reported is 0 minutes, 54 seconds. No right -left markers were placed. The initial image reveals the native femoral head and neck. The subsequent image reveals placement of a hip joint prosthesis. Radiographic positioning of the prosthetic components appears good. IMPRESSION: 2 intraoperative fluoro spot images reveal placement of a hip joint prosthesis presumably on the right. Electronically Signed   By: David  Martinique M.D.   On: 07/18/2016 11:53   Dg Hip Unilat W Or W/o Pelvis 2-3 Views Right  Result Date: 07/18/2016 CLINICAL DATA:  Post right hip prosthesis EXAM: DG HIP (WITH OR WITHOUT PELVIS) 2-3V RIGHT COMPARISON:  None. FINDINGS: Two views of the right hip submitted. There is right hip prosthesis with anatomic alignment. Postsurgical drain in place. Lateral skin staples are noted. IMPRESSION: Right hip prosthesis with anatomic alignment. Electronically Signed   By: Lahoma Crocker M.D.   On: 07/18/2016 12:32    Disposition: Final discharge disposition not confirmed    Follow-up Information    MENZ,MICHAEL, MD. Go in 2 week(s).   Specialty:  Orthopedic Surgery Why:  for staple removal Contact information: [address] [place] [address] [phone]            Signed: Dorise Hiss [facility] 07/21/2016, 12:03 PM

## 2016-07-21 NOTE — Progress Notes (Signed)
Physical Therapy Treatment Patient Details Name: Tammy Griffin MRN: [id number] DOB: 1961-04-20 Today's Date: 07/21/2016    History of Present Illness Pt. is a 55 y.o. female who was admitted for a right THR.    PT Comments    Pt is making good progress towards goals and continues to be motivated to perform therapy. Reviewed HEP this date and answered all questions. Pt is safe to dc home at this time. Improved gait pattern with safe technique and use of RW. Will continue efforts as needed.  Follow Up Recommendations  Home health PT     Equipment Recommendations       Recommendations for Other Services       Precautions / Restrictions Precautions Precautions: Anterior Hip;Fall Precaution Booklet Issued: Yes (comment) Restrictions Weight Bearing Restrictions: Yes RLE Weight Bearing: Weight bearing as tolerated    Mobility  Bed Mobility Overal bed mobility: Needs Assistance Bed Mobility: Supine to Sit     Supine to sit: Min guard     General bed mobility comments: safe technique with all bed mobility. Pt does not require cues for sequencing at this time and able to sit at EOB with supervision.  Transfers Overall transfer level: Needs assistance Equipment used: Rolling walker (2 wheeled) Transfers: Sit to/from Stand Sit to Stand: Modified independent (Device/Increase time)         General transfer comment: safe technique performed with upright posture noted. Correct use of RW  Ambulation/Gait Ambulation/Gait assistance: Supervision Ambulation Distance (Feet): 200 Feet Assistive device: Rolling walker (2 wheeled) Gait Pattern/deviations: Step-through pattern     General Gait Details: ambulated with smooth reciprocal gait pattern. Slow speed noted, however able to perform heel strike, upright posture, and symmetrical step length. Pt still requires cues for R hip neutral rotation as she tends to perform IR during stepping.   Stairs             Wheelchair Mobility    Modified Rankin (Stroke Patients Only)       Balance                                    Cognition Arousal/Alertness: Awake/alert Behavior During Therapy: WFL for tasks assessed/performed Overall Cognitive Status: Within Functional Limits for tasks assessed                      Exercises Other Exercises Other Exercises: Supine ther-ex performed x 15 reps including ankle pumps, quad sets, and glut sets. HEP reviewed. Pt reports compliance with all ther-ex and wishes to go ahead with mobility as breakfast just arrived.    General Comments        Pertinent Vitals/Pain Pain Assessment: 0-10 Pain Score: 2  Pain Location: R hip Pain Descriptors / Indicators: Operative site guarding Pain Intervention(s): Limited activity within patient's tolerance;Repositioned    Home Living                      Prior Function            PT Goals (current goals can now be found in the care plan section) Acute Rehab PT Goals Patient Stated Goal: To get stronger PT Goal Formulation: With patient Time For Goal Achievement: 08/01/16 Potential to Achieve Goals: Good Progress towards PT goals: Progressing toward goals    Frequency    BID      PT Plan Current plan remains  appropriate    Co-evaluation             End of Session Equipment Utilized During Treatment: Gait belt Activity Tolerance: Patient tolerated treatment well Patient left: in chair;with chair alarm set     Time: [id number] PT Time Calculation (min) (ACUTE ONLY): 26 min  Charges:  $Gait Training: 8-22 mins $Therapeutic Exercise: 8-22 mins                    G Codes:      Trenda Corliss 08-09-2016, 11:12 AM  Greggory Stallion, PT, DPT [phone]

## 2017-10-29 ENCOUNTER — Other Ambulatory Visit: Payer: Self-pay | Admitting: Family Medicine

## 2017-10-29 DIAGNOSIS — Z1231 Encounter for screening mammogram for malignant neoplasm of breast: Secondary | ICD-10-CM

## 2017-10-29 DIAGNOSIS — Z803 Family history of malignant neoplasm of breast: Secondary | ICD-10-CM

## 2017-11-16 ENCOUNTER — Ambulatory Visit
Admission: RE | Admit: 2017-11-16 | Discharge: 2017-11-16 | Disposition: A | Discharge status: Home / Self Care | Payer: BLUE CROSS/BLUE SHIELD | Source: Ambulatory Visit | Attending: Family Medicine | Admitting: Family Medicine

## 2017-11-16 DIAGNOSIS — Z803 Family history of malignant neoplasm of breast: Secondary | ICD-10-CM

## 2017-11-16 DIAGNOSIS — Z1231 Encounter for screening mammogram for malignant neoplasm of breast: Secondary | ICD-10-CM

## 2018-12-04 ENCOUNTER — Other Ambulatory Visit: Payer: Self-pay | Admitting: Nephrology

## 2018-12-04 DIAGNOSIS — R109 Unspecified abdominal pain: Secondary | ICD-10-CM

## 2019-01-03 ENCOUNTER — Ambulatory Visit: Payer: BLUE CROSS/BLUE SHIELD

## 2019-08-04 ENCOUNTER — Other Ambulatory Visit: Payer: Self-pay | Admitting: Orthopedic Surgery

## 2019-08-04 DIAGNOSIS — M25561 Pain in right knee: Secondary | ICD-10-CM

## 2019-08-18 ENCOUNTER — Ambulatory Visit
Admission: RE | Admit: 2019-08-18 | Discharge: 2019-08-18 | Disposition: A | Discharge status: Home / Self Care | Payer: BC Managed Care – PPO | Source: Ambulatory Visit | Attending: Orthopedic Surgery | Admitting: Orthopedic Surgery

## 2019-08-18 ENCOUNTER — Other Ambulatory Visit: Payer: Self-pay

## 2019-08-18 DIAGNOSIS — M25561 Pain in right knee: Secondary | ICD-10-CM | POA: Diagnosis not present

## 2019-08-22 ENCOUNTER — Other Ambulatory Visit: Payer: Self-pay

## 2019-08-22 ENCOUNTER — Other Ambulatory Visit: Payer: Self-pay | Admitting: Orthopedic Surgery

## 2019-08-26 ENCOUNTER — Other Ambulatory Visit
Admission: RE | Admit: 2019-08-26 | Discharge: 2019-08-26 | Disposition: A | Discharge status: Home / Self Care | Payer: BC Managed Care – PPO | Source: Ambulatory Visit | Attending: Orthopedic Surgery | Admitting: Orthopedic Surgery

## 2019-08-26 DIAGNOSIS — Z20828 Contact with and (suspected) exposure to other viral communicable diseases: Secondary | ICD-10-CM | POA: Diagnosis not present

## 2019-08-26 DIAGNOSIS — Z01812 Encounter for preprocedural laboratory examination: Secondary | ICD-10-CM | POA: Diagnosis present

## 2019-08-27 LAB — SARS CORONAVIRUS 2 (TAT 6-24 HRS): SARS Coronavirus 2: NEGATIVE

## 2019-08-29 ENCOUNTER — Ambulatory Visit: Payer: BC Managed Care – PPO | Admitting: Anesthesiology

## 2019-08-29 ENCOUNTER — Other Ambulatory Visit: Payer: Self-pay

## 2019-08-29 ENCOUNTER — Encounter: Payer: Self-pay | Admitting: Orthopedic Surgery

## 2019-08-29 ENCOUNTER — Ambulatory Visit
Admission: RE | Admit: 2019-08-29 | Discharge: 2019-08-29 | Disposition: A | Discharge status: Home / Self Care | Payer: BC Managed Care – PPO | Attending: Orthopedic Surgery | Admitting: Orthopedic Surgery

## 2019-08-29 ENCOUNTER — Encounter
Admission: RE | Disposition: A | Discharge status: Home / Self Care | Payer: Self-pay | Source: Home / Self Care | Attending: Orthopedic Surgery

## 2019-08-29 DIAGNOSIS — Z96641 Presence of right artificial hip joint: Secondary | ICD-10-CM | POA: Diagnosis not present

## 2019-08-29 DIAGNOSIS — S83241A Other tear of medial meniscus, current injury, right knee, initial encounter: Secondary | ICD-10-CM | POA: Insufficient documentation

## 2019-08-29 DIAGNOSIS — J45909 Unspecified asthma, uncomplicated: Secondary | ICD-10-CM | POA: Diagnosis not present

## 2019-08-29 DIAGNOSIS — I10 Essential (primary) hypertension: Secondary | ICD-10-CM | POA: Diagnosis not present

## 2019-08-29 DIAGNOSIS — Z87891 Personal history of nicotine dependence: Secondary | ICD-10-CM | POA: Diagnosis not present

## 2019-08-29 DIAGNOSIS — Z6837 Body mass index (BMI) 37.0-37.9, adult: Secondary | ICD-10-CM | POA: Insufficient documentation

## 2019-08-29 DIAGNOSIS — X58XXXA Exposure to other specified factors, initial encounter: Secondary | ICD-10-CM | POA: Diagnosis not present

## 2019-08-29 DIAGNOSIS — K219 Gastro-esophageal reflux disease without esophagitis: Secondary | ICD-10-CM | POA: Diagnosis not present

## 2019-08-29 DIAGNOSIS — G4730 Sleep apnea, unspecified: Secondary | ICD-10-CM | POA: Diagnosis not present

## 2019-08-29 HISTORY — PX: KNEE ARTHROSCOPY WITH MEDIAL MENISECTOMY: SHX5651

## 2019-08-29 HISTORY — DX: Sleep apnea, unspecified: G47.30

## 2019-08-29 SURGERY — ARTHROSCOPY, KNEE, WITH MEDIAL MENISCECTOMY
Anesthesia: General | Site: Knee | Laterality: Right

## 2019-08-29 MED ORDER — BUPIVACAINE HCL (PF) 0.5 % IJ SOLN
INTRAMUSCULAR | Status: DC | PRN
  Administered 2019-08-29: 4 mL

## 2019-08-29 MED ORDER — FENTANYL CITRATE (PF) 100 MCG/2ML IJ SOLN
INTRAMUSCULAR | Status: DC | PRN
  Administered 2019-08-29 (×4): 25 ug via INTRAVENOUS

## 2019-08-29 MED ORDER — ACETAMINOPHEN 500 MG PO TABS: 1000.0000 mg | ORAL_TABLET | Freq: Three times a day (TID) | ORAL | 2 refills | Status: AC

## 2019-08-29 MED ORDER — HYDROCODONE-ACETAMINOPHEN 5-325 MG PO TABS: 1.0000 | ORAL_TABLET | ORAL | 0 refills | Status: AC | PRN

## 2019-08-29 MED ORDER — GLYCOPYRROLATE 0.2 MG/ML IJ SOLN
INTRAMUSCULAR | Status: DC | PRN
  Administered 2019-08-29: .1 mg via INTRAVENOUS

## 2019-08-29 MED ORDER — FENTANYL CITRATE (PF) 100 MCG/2ML IJ SOLN: 25.0000 ug | INTRAMUSCULAR | Status: DC | PRN

## 2019-08-29 MED ORDER — IBUPROFEN 800 MG PO TABS: 800.0000 mg | ORAL_TABLET | Freq: Three times a day (TID) | ORAL | 1 refills | Status: AC

## 2019-08-29 MED ORDER — ONDANSETRON HCL 4 MG/2ML IJ SOLN: 4.0000 mg | Freq: Once | INTRAMUSCULAR | Status: DC | PRN

## 2019-08-29 MED ORDER — SCOPOLAMINE 1 MG/3DAYS TD PT72
1.0000 | MEDICATED_PATCH | Freq: Once | TRANSDERMAL | Status: DC
  Administered 2019-08-29: 1.5 mg via TRANSDERMAL

## 2019-08-29 MED ORDER — CEFAZOLIN SODIUM-DEXTROSE 2-4 GM/100ML-% IV SOLN
2.0000 g | INTRAVENOUS | Status: AC
  Administered 2019-08-29: 2 g via INTRAVENOUS

## 2019-08-29 MED ORDER — LIDOCAINE HCL (CARDIAC) PF 100 MG/5ML IV SOSY
PREFILLED_SYRINGE | INTRAVENOUS | Status: DC | PRN
  Administered 2019-08-29: 30 mg via INTRATRACHEAL

## 2019-08-29 MED ORDER — MIDAZOLAM HCL 5 MG/5ML IJ SOLN
INTRAMUSCULAR | Status: DC | PRN
  Administered 2019-08-29: 2 mg via INTRAVENOUS

## 2019-08-29 MED ORDER — ACETAMINOPHEN 325 MG PO TABS: 325.0000 mg | ORAL_TABLET | ORAL | Status: DC | PRN

## 2019-08-29 MED ORDER — ASPIRIN EC 325 MG PO TBEC: 325.0000 mg | DELAYED_RELEASE_TABLET | Freq: Every day | ORAL | 0 refills | Status: AC

## 2019-08-29 MED ORDER — LIDOCAINE-EPINEPHRINE 1 %-1:100000 IJ SOLN
INTRAMUSCULAR | Status: DC | PRN
  Administered 2019-08-29: 4 mL

## 2019-08-29 MED ORDER — ONDANSETRON HCL 4 MG/2ML IJ SOLN
INTRAMUSCULAR | Status: DC | PRN
  Administered 2019-08-29: 4 mg via INTRAVENOUS

## 2019-08-29 MED ORDER — OXYCODONE HCL 5 MG PO TABS
5.0000 mg | ORAL_TABLET | Freq: Once | ORAL | Status: AC | PRN
  Administered 2019-08-29: 5 mg via ORAL

## 2019-08-29 MED ORDER — ONDANSETRON 4 MG PO TBDP: 4.0000 mg | ORAL_TABLET | Freq: Three times a day (TID) | ORAL | 0 refills | Status: AC | PRN

## 2019-08-29 MED ORDER — PROPOFOL 10 MG/ML IV BOLUS
INTRAVENOUS | Status: DC | PRN
  Administered 2019-08-29: 200 mg via INTRAVENOUS

## 2019-08-29 MED ORDER — ACETAMINOPHEN 160 MG/5ML PO SOLN: 325.0000 mg | ORAL | Status: DC | PRN

## 2019-08-29 MED ORDER — LACTATED RINGERS IV SOLN
100.0000 mL/h | INTRAVENOUS | Status: DC
  Administered 2019-08-29: 100 mL/h via INTRAVENOUS

## 2019-08-29 MED ORDER — CHLORHEXIDINE GLUCONATE 4 % EX LIQD: 60.0000 mL | Freq: Once | CUTANEOUS | Status: DC

## 2019-08-29 MED ORDER — DEXAMETHASONE SODIUM PHOSPHATE 4 MG/ML IJ SOLN
INTRAMUSCULAR | Status: DC | PRN
  Administered 2019-08-29: 4 mg via INTRAVENOUS

## 2019-08-29 MED ORDER — OXYCODONE HCL 5 MG/5ML PO SOLN: 5.0000 mg | Freq: Once | ORAL | Status: AC | PRN

## 2019-08-29 SURGICAL SUPPLY — 36 items
ADAPTER IRRIG TUBE 2 SPIKE SOL (ADAPTER) ×6 IMPLANT
BLADE SURG SZ11 CARB STEEL (BLADE) ×3 IMPLANT
BNDG COHESIVE 4X5 TAN STRL (GAUZE/BANDAGES/DRESSINGS) ×3 IMPLANT
BNDG ESMARK 6X12 TAN STRL LF (GAUZE/BANDAGES/DRESSINGS) ×3 IMPLANT
BUR RADIUS 4.0X18.5 (BURR) ×2 IMPLANT
CHLORAPREP W/TINT 26 (MISCELLANEOUS) ×3 IMPLANT
COOLER POLAR GLACIER W/PUMP (MISCELLANEOUS) ×3 IMPLANT
COVER LIGHT HANDLE UNIVERSAL (MISCELLANEOUS) ×6 IMPLANT
CUFF TOURN SGL QUICK 30 (TOURNIQUET CUFF) ×2
CUFF TRNQT CYL 30X4X21-28X (TOURNIQUET CUFF) IMPLANT
DRAPE IMP U-DRAPE 54X76 (DRAPES) ×3 IMPLANT
GAUZE SPONGE 4X4 12PLY STRL (GAUZE/BANDAGES/DRESSINGS) ×3 IMPLANT
GLOVE BIO SURGEON STRL SZ7.5 (GLOVE) ×3 IMPLANT
GLOVE BIOGEL PI IND STRL 8 (GLOVE) ×1 IMPLANT
GLOVE BIOGEL PI INDICATOR 8 (GLOVE) ×2
GOWN STRL REIN 2XL XLG LVL4 (GOWN DISPOSABLE) ×3 IMPLANT
GOWN STRL REUS W/ TWL LRG LVL3 (GOWN DISPOSABLE) ×1 IMPLANT
GOWN STRL REUS W/TWL LRG LVL3 (GOWN DISPOSABLE) ×2
IV LACTATED RINGER IRRG 3000ML (IV SOLUTION) ×8
IV LR IRRIG 3000ML ARTHROMATIC (IV SOLUTION) ×4 IMPLANT
KIT TURNOVER KIT A (KITS) ×3 IMPLANT
MANIFOLD NEPTUNE II (INSTRUMENTS) ×3 IMPLANT
MAT ABSORB  FLUID 56X50 GRAY (MISCELLANEOUS) ×2
MAT ABSORB FLUID 56X50 GRAY (MISCELLANEOUS) ×1 IMPLANT
PACK ARTHROSCOPY KNEE (MISCELLANEOUS) ×3 IMPLANT
PAD WRAPON POLAR KNEE (MISCELLANEOUS) ×1 IMPLANT
PADDING CAST BLEND 6X4 STRL (MISCELLANEOUS) ×1 IMPLANT
PADDING STRL CAST 6IN (MISCELLANEOUS) ×2
SET TUBE SUCT SHAVER OUTFL 24K (TUBING) ×3 IMPLANT
SET TUBE TIP INTRA-ARTICULAR (MISCELLANEOUS) ×3 IMPLANT
SUT ETHILON 3-0 FS-10 30 BLK (SUTURE) ×3
SUTURE EHLN 3-0 FS-10 30 BLK (SUTURE) ×1 IMPLANT
TOWEL OR 17X26 4PK STRL BLUE (TOWEL DISPOSABLE) ×6 IMPLANT
TUBING ARTHRO INFLOW-ONLY STRL (TUBING) ×3 IMPLANT
WAND WEREWOLF FLOW 90D (MISCELLANEOUS) ×2 IMPLANT
WRAPON POLAR PAD KNEE (MISCELLANEOUS) ×3

## 2019-08-29 NOTE — Transfer of Care (Signed)
Immediate Anesthesia Transfer of Care Note  Patient: Tammy Griffin  Procedure(s) Performed: Arthroscopic Partial MEDIAL MENISCECTOMY (Right Knee)  Patient Location: PACU  Anesthesia Type: General LMA  Level of Consciousness: awake, alert  and patient cooperative  Airway and Oxygen Therapy: Patient Spontanous Breathing and Patient connected to supplemental oxygen  Post-op Assessment: Post-op Vital signs reviewed, Patient's Cardiovascular Status Stable, Respiratory Function Stable, Patent Airway and No signs of Nausea or vomiting  Post-op Vital Signs: Reviewed and stable  Complications: No apparent anesthesia complications

## 2019-08-29 NOTE — Anesthesia Procedure Notes (Signed)
Procedure Name: LMA Insertion Date/Time: 08/29/2019 1:25 PM Performed by: Cameron Ali, CRNA Pre-anesthesia Checklist: Patient identified, Emergency Drugs available, Suction available, Timeout performed and Patient being monitored Patient Re-evaluated:Patient Re-evaluated prior to induction Oxygen Delivery Method: Circle system utilized Preoxygenation: Pre-oxygenation with 100% oxygen Induction Type: IV induction LMA: LMA inserted LMA Size: 4.0 Number of attempts: 1 Placement Confirmation: positive ETCO2 and breath sounds checked- equal and bilateral Tube secured with: Tape Dental Injury: Teeth and Oropharynx as per pre-operative assessment

## 2019-08-29 NOTE — Op Note (Signed)
Operative Note    SURGERY DATE: 08/29/2019   PRE-OP DIAGNOSIS:  1. Right medial meniscus tear 2. Right patellofemoral compartment degenerative changes   POST-OP DIAGNOSIS:  1. Right medial meniscus tear (75% width tear at the posterior horn meniscus root) 2. Right patellofemoral and medial compartment degenerative changes  PROCEDURES:  1. Right knee arthroscopy, partial medial meniscectomy 2. Chondroplasty of patellofemoral and medial compartment   SURGEON: Cato Mulligan, MD   ANESTHESIA: Gen   ESTIMATED BLOOD LOSS: minimal   TOTAL IV FLUIDS: per anesthesia   INDICATION(S):  Tammy Griffin is a 58 y.o. female with signs and symptoms as well as MRI finding of medial meniscus tear. After discussion of risks, benefits, and alternatives to surgery, the patient elected to proceed.   OPERATIVE FINDINGS:    Examination under anesthesia: A careful examination under anesthesia was performed.  Passive range of motion was: Hyperextension: 5.  Extension: 0.  Flexion: 130.  Lachman: normal. Pivot Shift: normal.  Posterior drawer: normal.  Varus stability in full extension: normal.  Varus stability in 30 degrees of flexion: normal.  Valgus stability in full extension: normal.  Valgus stability in 30 degrees of flexion: normal.   Intra-operative findings: A thorough arthroscopic examination of the knee was performed.  The findings are: 1. Suprapatellar pouch: Normal 2. Undersurface of median ridge: Grade 2 degenerative changes 3. Medial patellar facet:  Grade 2 degenerative changes 4. Lateral patellar facet: Grade 3 degenerative changes 5. Trochlea: Normal 6. Lateral gutter/popliteus tendon: Normal 7. Hoffa's fat pad: Inflamed 8. Medial gutter/plica: Normal 9. ACL: Normal 10. PCL: Normal 11. Medial meniscus: Radial tear of the posterior horn of the medial meniscus adjacent to the meniscus root affecting approximately 75% of the meniscus width 12. Medial compartment cartilage: Areas of  grade 2-3 degenerative changes to the medial femoral condyle.  Grade 1 degenerative changes the tibial plateau 13. Lateral meniscus: Normal 14. Lateral compartment cartilage:  Normal   OPERATIVE REPORT:     I identified Tammy Griffin in the pre-operative holding area. I marked the operative knee with my initials. I reviewed the risks and benefits of the proposed surgical intervention and the patient (and/or patient's guardian) wished to proceed. The patient was transferred to the operative suite and placed in the supine position with all bony prominences padded.  Anesthesia was administered. Appropriate IV antibiotics were administered prior to incision. The extremity was then prepped and draped in standard fashion. A time out was performed confirming the correct extremity, correct patient, and correct procedure.   Arthroscopy portals were marked. Local anesthetic was injected to the planned portal sites. The anterolateral portal was established with an 11 blade.      The arthroscope was placed in the anterolateral portal and then into the suprapatellar pouch.  A diagnostic knee scope was completed with the above findings. The medial meniscus tear was identified.   Next the medial portal was established under needle localization. The MCL was pie-crusted to improve visualization of the posterior horn. The meniscal tear was debrided using an arthroscopic biter and an oscillating shaver until the meniscus had stable borders.  Stability of the residual meniscus as well integrity of some of the fibers of the meniscus root was confirmed with a probe.  A chondroplasty was performed of the medial compartment and patellofemoral compartment such that there were stable cartilage edges without any loose fragments of cartilage. Arthroscopic fluid was removed from the joint.   The portals were closed with 3-0 Nylon suture.  Sterile dressings included Xeroform, 4x4s, Sof-Rol, and Bias wrap. A Polarcare was  placed.  The patient was then awakened and taken to the PACU hemodynamically stable without complication.     POSTOPERATIVE PLAN: The patient will be discharged home today once they meet PACU criteria. Aspirin 325 mg daily was prescribed for 2 weeks for DVT prophylaxis.  Physical therapy will start on POD#3-4. Weight-bearing as tolerated. Follow up in 2 weeks per protocol.

## 2019-08-29 NOTE — H&P (Signed)
Paper H&P to be scanned into permanent record. H&P reviewed. No significant changes noted.  

## 2019-08-29 NOTE — Anesthesia Postprocedure Evaluation (Signed)
Anesthesia Post Note  Patient: Tammy Griffin  Procedure(s) Performed: Arthroscopic Partial MEDIAL MENISCECTOMY (Right Knee)     Patient location during evaluation: PACU Anesthesia Type: General Level of consciousness: awake and alert and oriented Pain management: satisfactory to patient Vital Signs Assessment: post-procedure vital signs reviewed and stable Respiratory status: spontaneous breathing, nonlabored ventilation and respiratory function stable Cardiovascular status: blood pressure returned to baseline and stable Postop Assessment: Adequate PO intake and No signs of nausea or vomiting Anesthetic complications: no    Raliegh Ip

## 2019-08-29 NOTE — Anesthesia Preprocedure Evaluation (Signed)
Anesthesia Evaluation  Patient identified by MRN, date of birth, ID band Patient awake    Reviewed: Allergy & Precautions, H&P , NPO status , Patient's Chart, lab work & pertinent test results  Airway Mallampati: II  TM Distance: >3 FB Neck ROM: full    Dental no notable dental hx.    Pulmonary shortness of breath, sleep apnea and Continuous Positive Airway Pressure Ventilation , former smoker,    Pulmonary exam normal breath sounds clear to auscultation       Cardiovascular hypertension, Normal cardiovascular exam Rhythm:regular Rate:Normal     Neuro/Psych    GI/Hepatic GERD  ,  Endo/Other  Morbid obesity  Renal/GU      Musculoskeletal   Abdominal   Peds  Hematology   Anesthesia Other Findings   Reproductive/Obstetrics                             Anesthesia Physical Anesthesia Plan  ASA: III  Anesthesia Plan: General LMA   Post-op Pain Management:    Induction:   PONV Risk Score and Plan: 3 and Ondansetron, Dexamethasone, Midazolam and Scopolamine patch - Pre-op  Airway Management Planned:   Additional Equipment:   Intra-op Plan:   Post-operative Plan:   Informed Consent: I have reviewed the patients History and Physical, chart, labs and discussed the procedure including the risks, benefits and alternatives for the proposed anesthesia with the patient or authorized representative who has indicated his/her understanding and acceptance.       Plan Discussed with: CRNA  Anesthesia Plan Comments:         Anesthesia Quick Evaluation

## 2019-08-29 NOTE — Discharge Instructions (Signed)
Arthroscopic Knee Surgery - Partial Meniscectomy   Post-Op Instructions   1. Bracing or crutches: Crutches will be provided at the time of discharge from the surgery center if you do not already have them.   2. Ice: You may be provided with a device [facility]) that allows you to ice the affected area effectively. Otherwise you can ice manually.    3. Driving:  Plan on not driving for at least two weeks. Please note that you are advised NOT to drive while taking narcotic pain medications as you may be impaired and unsafe to drive.   4. Activity: Ankle pumps several times an hour while awake to prevent blood clots. Weight bearing: as tolerated. Use crutches for as needed (usually ~1 week or less) until pain allows you to ambulate without a limp. Bending and straightening the knee is unlimited. Elevate knee above heart level as much as possible for one week. Avoid standing more than 5 minutes (consecutively) for the first week.  Avoid long distance travel for 2 weeks.  5. Medications:  - You have been provided a prescription for narcotic pain medicine. After surgery, take 1-2 narcotic tablets every 4 hours if needed for severe pain.  - You may take up to 3000mg /day of tylenol (acetaminophen). You can take 1000mg  3x/day. Please check your narcotic. If you have acetaminophen in your narcotic (each tablet will be 325mg ), be careful not to exceed a total of 3000mg /day of acetaminophen.  - A prescription for anti-nausea medication will be provided in case the narcotic medicine or anesthesia causes nausea - take 1 tablet every 6 hours only if nauseated.  - Take ibuprofen 800 mg every 8 hours WITH food to reduce post-operative knee swelling. DO NOT STOP IBUPROFEN POST-OP UNTIL INSTRUCTED TO DO SO at first post-op office visit (10-14 days after surgery). However, please discontinue if you have any abdominal discomfort after taking this.  - Take enteric coated aspirin 325 mg once daily for 2 weeks to prevent  blood clots.    6. Bandages: The physical therapist should change the bandages at the first post-op appointment. If needed, the dressing supplies have been provided to you.   7. Physical Therapy: 1-2 times per week for 6 weeks. Therapy typically starts on post operative Day 3 or 4. You have been provided an order for physical therapy. The therapist will provide home exercises.   8. Work: May return to full work usually around 2 weeks after 1st post-operative visit. May do light duty/desk job in approximately 1-2 weeks when off of narcotics, pain is well-controlled, and swelling has decreased. Labor intensive jobs may require 4-6 weeks to return.      9. Post-Op Appointments: Your first post-op appointment will be with Dr. Posey Pronto in approximately 2 weeks time.    If you find that they have not been scheduled please call the Orthopaedic Appointment front desk at [phone].    Scopolamine skin patches REMOVE PATCH IN 72 HOURS AND Impact HANDS IMMEDIATELY What is this medicine? SCOPOLAMINE (skoe POL a meen) is used to prevent nausea and vomiting caused by motion sickness, anesthesia and surgery. This medicine may be used for other purposes; ask your health care provider or pharmacist if you have questions. COMMON BRAND NAME(S): Transderm Scop What should I tell my health care provider before I take this medicine? They need to know if you have any of these conditions:  are scheduled to have a gastric secretion test  glaucoma  heart disease  kidney disease  liver disease  lung or breathing disease, like asthma  mental illness  prostate disease  seizures  stomach or intestine problems  trouble passing urine  an unusual or allergic reaction to scopolamine, atropine, other medicines, foods, dyes, or preservatives  pregnant or trying to get pregnant  breast-feeding How should I use this medicine? This medicine is for external use only. Follow the directions on the  prescription label. Wear only 1 patch at a time. Choose an area behind the ear, that is clean, dry, hairless and free from any cuts or irritation. Wipe the area with a clean dry tissue. Peel off the plastic backing of the skin patch, trying not to touch the adhesive side with your hands. Do not cut the patches. Firmly apply to the area you have chosen, with the metallic side of the patch to the skin and the tan-colored side showing. Once firmly in place, wash your hands well with soap and water. Do not get this medicine into your eyes. After removing the patch, wash your hands and the area behind your ear thoroughly with soap and water. The patch will still contain some medicine after use. To avoid accidental contact or ingestion by children or pets, fold the used patch in half with the sticky side together and throw away in the trash out of the reach of children and pets. If you need to use a second patch after you remove the first, place it behind the other ear. A special MedGuide will be given to you by the pharmacist with each prescription and refill. Be sure to read this information carefully each time. Talk to your pediatrician regarding the use of this medicine in children. Special care may be needed. Overdosage: If you think you have taken too much of this medicine contact a poison control center or emergency room at once. NOTE: This medicine is only for you. Do not share this medicine with others. What if I miss a dose? This does not apply. This medicine is not for regular use. What may interact with this medicine?  alcohol  antihistamines for allergy cough and cold  atropine  certain medicines for anxiety or sleep  certain medicines for bladder problems like oxybutynin, tolterodine  certain medicines for depression like amitriptyline, fluoxetine, sertraline  certain medicines for stomach problems like dicyclomine, hyoscyamine  certain medicines for Parkinson's disease like  benztropine, trihexyphenidyl  certain medicines for seizures like phenobarbital, primidone  general anesthetics like halothane, isoflurane, methoxyflurane, propofol  ipratropium  local anesthetics like lidocaine, pramoxine, tetracaine  medicines that relax muscles for surgery  phenothiazines like chlorpromazine, mesoridazine, prochlorperazine, thioridazine  narcotic medicines for pain  other belladonna alkaloids This list may not describe all possible interactions. Give your health care provider a list of all the medicines, herbs, non-prescription drugs, or dietary supplements you use. Also tell them if you smoke, drink alcohol, or use illegal drugs. Some items may interact with your medicine. What should I watch for while using this medicine? Limit contact with water while swimming and bathing because the patch may fall off. If the patch falls off, throw it away and put a new one behind the other ear. You may get drowsy or dizzy. Do not drive, use machinery, or do anything that needs mental alertness until you know how this medicine affects you. Do not stand or sit up quickly, especially if you are an older patient. This reduces the risk of dizzy or fainting spells. Alcohol may interfere with the effect of this medicine.  Avoid alcoholic drinks. Your mouth may get dry. Chewing sugarless gum or sucking hard candy, and drinking plenty of water may help. Contact your healthcare professional if the problem does not go away or is severe. This medicine may cause dry eyes and blurred vision. If you wear contact lenses, you may feel some discomfort. Lubricating drops may help. See your healthcare professional if the problem does not go away or is severe. If you are going to need surgery, an MRI, CT scan, or other procedure, tell your healthcare professional that you are using this medicine. You may need to remove the patch before the procedure. What side effects may I notice from receiving this  medicine? Side effects that you should report to your doctor or health care professional as soon as possible:  allergic reactions like skin rash, itching or hives; swelling of the face, lips, or tongue  blurred vision  changes in vision  confusion  dizziness  eye pain  fast, irregular heartbeat  hallucinations, loss of contact with reality  nausea, vomiting  pain or trouble passing urine  restlessness  seizures  skin irritation  stomach pain Side effects that usually do not require medical attention (report to your doctor or health care professional if they continue or are bothersome):  drowsiness  dry mouth  headache  sore throat This list may not describe all possible side effects. Call your doctor for medical advice about side effects. You may report side effects to FDA at 1-800-FDA-1088. Where should I keep my medicine? Keep out of the reach of children. Store at room temperature between 20 and 25 degrees C (68 and 77 degrees F). Keep this medicine in the foil package until ready to use. Throw away any unused medicine after the expiration date. NOTE: This sheet is a summary. It may not cover all possible information. If you have questions about this medicine, talk to your doctor, pharmacist, or health care provider.  2020 Elsevier/Gold Standard (2017-11-23 16:14:46)   General Anesthesia, Adult, Care After This sheet gives you information about how to care for yourself after your procedure. Your health care provider may also give you more specific instructions. If you have problems or questions, contact your health care provider. What can I expect after the procedure? After the procedure, the following side effects are common:  Pain or discomfort at the IV site.  Nausea.  Vomiting.  Sore throat.  Trouble concentrating.  Feeling cold or chills.  Weak or tired.  Sleepiness and fatigue.  Soreness and body aches. These side effects can affect parts  of the body that were not involved in surgery. Follow these instructions at home:  For at least 24 hours after the procedure:  Have a responsible adult stay with you. It is important to have someone help care for you until you are awake and alert.  Rest as needed.  Do not: ? Participate in activities in which you could fall or become injured. ? Drive. ? Use heavy machinery. ? Drink alcohol. ? Take sleeping pills or medicines that cause drowsiness. ? Make important decisions or sign legal documents. ? Take care of children on your own. Eating and drinking  Follow any instructions from your health care provider about eating or drinking restrictions.  When you feel hungry, start by eating small amounts of foods that are soft and easy to digest (bland), such as toast. Gradually return to your regular diet.  Drink enough fluid to keep your urine pale yellow.  If you vomit,  rehydrate by drinking water, juice, or clear broth. General instructions  If you have sleep apnea, surgery and certain medicines can increase your risk for breathing problems. Follow instructions from your health care provider about wearing your sleep device: ? Anytime you are sleeping, including during daytime naps. ? While taking prescription pain medicines, sleeping medicines, or medicines that make you drowsy.  Return to your normal activities as told by your health care provider. Ask your health care provider what activities are safe for you.  Take over-the-counter and prescription medicines only as told by your health care provider.  If you smoke, do not smoke without supervision.  Keep all follow-up visits as told by your health care provider. This is important. Contact a health care provider if:  You have nausea or vomiting that does not get better with medicine.  You cannot eat or drink without vomiting.  You have pain that does not get better with medicine.  You are unable to pass urine.  You  develop a skin rash.  You have a fever.  You have redness around your IV site that gets worse. Get help right away if:  You have difficulty breathing.  You have chest pain.  You have blood in your urine or stool, or you vomit blood. Summary  After the procedure, it is common to have a sore throat or nausea. It is also common to feel tired.  Have a responsible adult stay with you for the first 24 hours after general anesthesia. It is important to have someone help care for you until you are awake and alert.  When you feel hungry, start by eating small amounts of foods that are soft and easy to digest (bland), such as toast. Gradually return to your regular diet.  Drink enough fluid to keep your urine pale yellow.  Return to your normal activities as told by your health care provider. Ask your health care provider what activities are safe for you. This information is not intended to replace advice given to you by your health care provider. Make sure you discuss any questions you have with your health care provider. Document Released: 12/11/2000 Document Revised: 09/07/2017 Document Reviewed: 04/20/2017 Elsevier Patient Education  2020 [employer].

## 2019-09-01 ENCOUNTER — Encounter: Payer: Self-pay | Admitting: *Deleted

## 2019-11-12 ENCOUNTER — Other Ambulatory Visit: Payer: Self-pay | Admitting: Gerontology

## 2019-11-12 DIAGNOSIS — Z1231 Encounter for screening mammogram for malignant neoplasm of breast: Secondary | ICD-10-CM

## 2019-12-15 DIAGNOSIS — C801 Malignant (primary) neoplasm, unspecified: Secondary | ICD-10-CM | POA: Insufficient documentation

## 2019-12-15 HISTORY — DX: Malignant (primary) neoplasm, unspecified: C80.1

## 2019-12-16 ENCOUNTER — Ambulatory Visit
Admission: RE | Admit: 2019-12-16 | Discharge: 2019-12-16 | Disposition: A | Discharge status: Home / Self Care | Payer: BC Managed Care – PPO | Source: Ambulatory Visit | Attending: Gerontology | Admitting: Gerontology

## 2019-12-16 ENCOUNTER — Other Ambulatory Visit: Payer: Self-pay

## 2019-12-16 DIAGNOSIS — Z1231 Encounter for screening mammogram for malignant neoplasm of breast: Secondary | ICD-10-CM | POA: Diagnosis not present

## 2021-01-11 ENCOUNTER — Other Ambulatory Visit: Payer: Self-pay | Admitting: Gerontology

## 2021-01-11 DIAGNOSIS — Z803 Family history of malignant neoplasm of breast: Secondary | ICD-10-CM

## 2021-01-11 DIAGNOSIS — Z87898 Personal history of other specified conditions: Secondary | ICD-10-CM

## 2021-01-11 DIAGNOSIS — Z1231 Encounter for screening mammogram for malignant neoplasm of breast: Secondary | ICD-10-CM

## 2021-10-21 ENCOUNTER — Other Ambulatory Visit: Payer: Self-pay | Admitting: Gerontology

## 2021-10-21 DIAGNOSIS — Z1231 Encounter for screening mammogram for malignant neoplasm of breast: Secondary | ICD-10-CM

## 2021-12-06 ENCOUNTER — Ambulatory Visit
Admission: RE | Admit: 2021-12-06 | Discharge: 2021-12-06 | Disposition: A | Discharge status: Home / Self Care | Payer: BC Managed Care – PPO | Source: Ambulatory Visit | Attending: Gerontology | Admitting: Gerontology

## 2021-12-06 ENCOUNTER — Other Ambulatory Visit: Payer: Self-pay

## 2021-12-06 DIAGNOSIS — Z1231 Encounter for screening mammogram for malignant neoplasm of breast: Secondary | ICD-10-CM | POA: Diagnosis present

## 2021-12-12 ENCOUNTER — Other Ambulatory Visit: Payer: Self-pay | Admitting: Gerontology

## 2021-12-12 DIAGNOSIS — R921 Mammographic calcification found on diagnostic imaging of breast: Secondary | ICD-10-CM

## 2021-12-13 ENCOUNTER — Ambulatory Visit
Admission: RE | Admit: 2021-12-13 | Discharge: 2021-12-13 | Disposition: A | Discharge status: Home / Self Care | Payer: BC Managed Care – PPO | Source: Ambulatory Visit | Attending: Gerontology | Admitting: Gerontology

## 2021-12-13 ENCOUNTER — Other Ambulatory Visit: Payer: Self-pay

## 2021-12-13 DIAGNOSIS — R921 Mammographic calcification found on diagnostic imaging of breast: Secondary | ICD-10-CM | POA: Diagnosis not present

## 2021-12-26 ENCOUNTER — Other Ambulatory Visit: Payer: Self-pay | Admitting: Gerontology

## 2021-12-26 DIAGNOSIS — R921 Mammographic calcification found on diagnostic imaging of breast: Secondary | ICD-10-CM

## 2021-12-26 DIAGNOSIS — R928 Other abnormal and inconclusive findings on diagnostic imaging of breast: Secondary | ICD-10-CM

## 2022-01-09 ENCOUNTER — Ambulatory Visit
Admission: RE | Admit: 2022-01-09 | Discharge: 2022-01-09 | Disposition: A | Discharge status: Home / Self Care | Payer: BC Managed Care – PPO | Source: Ambulatory Visit | Attending: Gerontology | Admitting: Gerontology

## 2022-01-09 DIAGNOSIS — R921 Mammographic calcification found on diagnostic imaging of breast: Secondary | ICD-10-CM

## 2022-01-09 DIAGNOSIS — R928 Other abnormal and inconclusive findings on diagnostic imaging of breast: Secondary | ICD-10-CM | POA: Diagnosis present

## 2022-01-09 HISTORY — PX: BREAST BIOPSY: SHX20

## 2022-01-10 DIAGNOSIS — D0512 Intraductal carcinoma in situ of left breast: Secondary | ICD-10-CM

## 2022-01-11 NOTE — Progress Notes (Signed)
Navigation initiated.  Scheduled Med/Onc consult with Dr. Janese Banks 01/16/22, and Surgical consult with Dr. Windell Moment 01/17/22.   ?

## 2022-01-16 ENCOUNTER — Inpatient Hospital Stay: Payer: BC Managed Care – PPO | Attending: Oncology | Admitting: Oncology

## 2022-01-16 ENCOUNTER — Encounter: Payer: Self-pay | Admitting: Oncology

## 2022-01-16 ENCOUNTER — Inpatient Hospital Stay: Payer: BC Managed Care – PPO

## 2022-01-16 VITALS — BP 133/74 | HR 66 | Temp 97.9°F | Resp 18 | Ht 67.75 in | Wt 244.6 lb

## 2022-01-16 DIAGNOSIS — Z803 Family history of malignant neoplasm of breast: Secondary | ICD-10-CM | POA: Diagnosis not present

## 2022-01-16 DIAGNOSIS — D0512 Intraductal carcinoma in situ of left breast: Secondary | ICD-10-CM

## 2022-01-16 DIAGNOSIS — C801 Malignant (primary) neoplasm, unspecified: Secondary | ICD-10-CM

## 2022-01-16 DIAGNOSIS — Z87891 Personal history of nicotine dependence: Secondary | ICD-10-CM | POA: Diagnosis not present

## 2022-01-16 DIAGNOSIS — Z90710 Acquired absence of both cervix and uterus: Secondary | ICD-10-CM | POA: Insufficient documentation

## 2022-01-16 DIAGNOSIS — I10 Essential (primary) hypertension: Secondary | ICD-10-CM | POA: Diagnosis not present

## 2022-01-16 DIAGNOSIS — G629 Polyneuropathy, unspecified: Secondary | ICD-10-CM

## 2022-01-16 NOTE — Research (Addendum)
Trial:  [facility]5-A  ?Patient Tammy Griffin was identified by Jeral Fruit, RN  as a potential candidate for the above listed study.  This Clinical Research Nurse met with Tammy Griffin, [id number], on 01/16/22 in a manner and location that ensures patient privacy to discuss participation in the above listed research study.  Patient is Accompanied by her spouse .  A copy of the informed consent document and separate HIPAA Authorization was provided to the patient.  Patient reads, speaks, and understands [place].   ?Patient was provided with the business card of this Nurse and encouraged to contact the research team with any questions.  Approximately 15 minutes were spent with the patient reviewing the informed consent documents.  Patient was provided the option of taking informed consent documents home to review and was encouraged to review at their convenience with their support network, including other care providers. Patient took the consent documents home to review. Patient understands this research nurse will reach out to her in the next couple of days for her interest in participation and planning.  ?Jeral Fruit, RN ?01/16/22 ?11:17 AM ? ?Research nurse reached out to the patient via telephone today to ascertain her interest in the protocol after reading over the protocol consent / Hipaa documents. The patient states she is interested in participating and is willing to come in prior to her scheduled surgery on 11/10/2021. Surgery is scheduled for 11/11/2021. She is requesting that her appointment for Dr. Janese Banks be moved to 2 weeks after her surgery as that was MD request. Patient is willing to keep the scheduled lab appointment on 11/10/2021 and add a research consent visit at 215 pm prior to research labs being drawn. Message sent to the schedulers with patient and research requests for visit scheduling changes. Patient denied having any questions concerning the consent / Hipaa and research  study procedures at this time. Patient was encouraged to call if she has any other questions or issues. Patient was informed she would here from the scheduling team regarding her appointment changes.  ?Jeral Fruit, RN ?01/19/22 ?12:20 PM ? ?  ?

## 2022-01-16 NOTE — Progress Notes (Signed)
? ?Hematology/Oncology Consult note ?[place] ?[phone] [phone] ? ?Patient Care Team: ?[place] as PCP - General ?Theodore Demark, RN as Oncology Nurse Navigator  ? ?Name of the patient: Tammy Griffin  ?[id number]  ?10/20/1960  ? ? 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  ?Referring physician-Shannon Michaelle Copas, NP ? ?Date of visit: 01/16/22 ? ? ?History of presenting illness- Patient is a 61 year old female who underwent a screening bilateral mammogram in March 2023 which showed possible calcifications in the left breast.  This was followed by diagnostic mammogram and ultrasound which showed 8 mm group of calcifications in the left breast.  These were biopsied and was consistent with DCIS high-grade with comedonecrosis and calcifications.  Patient has had a prior right breast biopsy which was negative. ? ?There is a strong family history of breast cancer in her sister as well as mother and maternal grandmother.  Patient is not sure if they ever had any genetic testing done.  Remote use of birth control.  Patient is not on any hormone replacement therapy.  She is G2, P2. ? ?ECOG PS- 1 ? ?Pain scale- 0 ? ? ?Review of systems- Review of Systems  ?Constitutional:  Negative for chills, fever, malaise/fatigue and weight loss.  ?HENT:  Negative for congestion, ear discharge and nosebleeds.   ?Eyes:  Negative for blurred vision.  ?Respiratory:  Negative for cough, hemoptysis, sputum production, shortness of breath and wheezing.   ?Cardiovascular:  Negative for chest pain, palpitations, orthopnea and claudication.  ?Gastrointestinal:  Negative for abdominal pain, blood in stool, constipation, diarrhea, heartburn, melena, nausea and vomiting.  ?Genitourinary:  Negative for dysuria, flank pain, frequency, hematuria and urgency.  ?Musculoskeletal:  Negative for back pain, joint pain and myalgias.  ?Skin:  Negative for rash.  ?Neurological:  Negative for  dizziness, tingling, focal weakness, seizures, weakness and headaches.  ?Endo/Heme/Allergies:  Does not bruise/bleed easily.  ?Psychiatric/Behavioral:  Negative for depression and suicidal ideas. The patient does not have insomnia.   ? ?Allergies  ?Allergen Reactions  ? Valium [Diazepam] Swelling  ? ? ?Patient Active Problem List  ? Diagnosis Date Noted  ? Neuropathy 01/16/2022  ? Cancer ([place]) 12/15/2019  ? Primary localized osteoarthritis of right hip 07/18/2016  ? ? ? ?Past Medical History:  ?Diagnosis Date  ? Arthritis   ? Cancer ([place]) 12/15/2019  ? basal cell ca  ? Carpal tunnel syndrome, bilateral   ? Dyspnea   ? with exertion; patient states it is because she is overweight  ? GERD (gastroesophageal reflux disease)   ? History of hiatal hernia   ? Hypertension   ? Neuropathy   ? Sleep apnea   ? uses cpap  ? ? ? ?Past Surgical History:  ?Procedure Laterality Date  ? ABDOMINAL HYSTERECTOMY    ? BREAST BIOPSY Left 01/09/2022  ? Stereo Bx, Ribbon Clip, path pending  ? BREAST EXCISIONAL BIOPSY Right 03/01/2012  ? benign neoplast  ? BREAST SURGERY Right 03/01/2012  ? Lumpectomy  ? INCONTINENCE SURGERY  2012  ? [place]    ? KNEE ARTHROSCOPY WITH MEDIAL MENISECTOMY Right 08/29/2019  ? Procedure: Arthroscopic Partial MEDIAL MENISCECTOMY;  Surgeon: Leim Fabry, MD;  Location: [place];  Service: Orthopedics;  Laterality: Right;  ? TOTAL HIP ARTHROPLASTY Right 07/18/2016  ? Procedure: TOTAL HIP ARTHROPLASTY ANTERIOR APPROACH;  Surgeon: Hessie Knows, MD;  Location: [facility];  Service: Orthopedics;  Laterality: Right;  ? ? ?Social History  ? ?Socioeconomic History  ?  Marital status: Married  ?  Spouse name: Not on file  ? Number of children: Not on file  ? Years of education: Not on file  ? Highest education level: Not on file  ?Occupational History  ? Not on file  ?Tobacco Use  ? Smoking status: Former  ?  Packs/day: 2.00  ?  Types: Cigarettes  ?  Quit date: 09/30/1997  ?   Years since quitting: 24.3  ? Smokeless tobacco: Never  ?Substance and Sexual Activity  ? Alcohol use: No  ? Drug use: Not on file  ? Sexual activity: Not on file  ?Other Topics Concern  ? Not on file  ?Social History Narrative  ? Not on file  ? ?Social Determinants of Health  ? ?Financial Resource Strain: Not on file  ?Food Insecurity: Not on file  ?Transportation Needs: Not on file  ?Physical Activity: Not on file  ?Stress: Not on file  ?Social Connections: Not on file  ?Intimate Partner Violence: Not on file  ? ?  ?Family History  ?Problem Relation Age of Onset  ? Breast cancer Mother 16  ? Breast cancer Sister   ?     43's  ? Breast cancer Maternal Aunt 60  ? Breast cancer Maternal Grandmother 72  ? Diabetes Father   ? Hypertension Father   ? ? ? ?Current Outpatient Medications:  ?  albuterol (VENTOLIN HFA) 108 (90 Base) MCG/ACT inhaler, Inhale into the lungs every 6 (six) hours as needed for wheezing or shortness of breath., Disp: , Rfl:  ?  amLODipine-olmesartan (AZOR) 10-40 MG tablet, Take 1 tablet by mouth daily., Disp: , Rfl:  ?  celecoxib (CELEBREX) 200 MG capsule, Take 200 mg by mouth daily., Disp: , Rfl:  ?  Cholecalciferol (VITAMIN D3) 250 MCG (10000 UT) capsule, Take 10,000 Units by mouth daily., Disp: , Rfl:  ?  fluticasone furoate-vilanterol (BREO ELLIPTA) 100-25 MCG/ACT AEPB, Inhale 1 puff into the lungs daily., Disp: , Rfl:  ?  Misc Natural Products (OSTEO BI-FLEX TRIPLE STRENGTH) TABS, Take by mouth., Disp: , Rfl:  ?  omeprazole (PRILOSEC) 20 MG capsule, Take 20 mg by mouth 2 (two) times daily before a meal., Disp: , Rfl:  ?  terbinafine (LAMISIL) 250 MG tablet, Take 250 mg by mouth daily., Disp: , Rfl:  ?  cyanocobalamin 1000 MCG tablet, Take 1,000 mcg by mouth daily. (Patient not taking: Reported on 01/16/2022), Disp: , Rfl:  ?  gabapentin (NEURONTIN) 300 MG capsule, Take 600 mg by mouth at bedtime. (Patient not taking: Reported on 01/16/2022), Disp: , Rfl:  ?  HYDROcodone-acetaminophen (NORCO)  5-325 MG tablet, Take 1-2 tablets by mouth every 4 (four) hours as needed for moderate pain or severe pain. (Patient not taking: Reported on 01/16/2022), Disp: 10 tablet, Rfl: 0 ?  Omega-3 Fatty Acids (SUPER OMEGA 3 EPA/DHA PO), Take by mouth. (Patient not taking: Reported on 01/16/2022), Disp: , Rfl:  ?  ondansetron (ZOFRAN ODT) 4 MG disintegrating tablet, Take 1 tablet (4 mg total) by mouth every 8 (eight) hours as needed for nausea or vomiting. (Patient not taking: Reported on 01/16/2022), Disp: 20 tablet, Rfl: 0 ?  tiZANidine (ZANAFLEX) 4 MG tablet, Take 4 mg by mouth at bedtime. (Patient not taking: Reported on 01/16/2022), Disp: , Rfl:  ?  Turmeric 400 MG CAPS, Take by mouth. (Patient not taking: Reported on 01/16/2022), Disp: , Rfl:  ?  valsartan-hydrochlorothiazide (DIOVAN-HCT) 160-25 MG tablet, Take 1 tablet by mouth daily. (Patient not taking: Reported on  01/16/2022), Disp: , Rfl:  ? ? ?Physical exam:  ?Vitals:  ? 01/16/22 1110  ?BP: 133/74  ?Pulse: 66  ?Resp: 18  ?Temp: 97.9 ?F (36.6 ?C)  ?SpO2: 97%  ?Weight: 244 lb 9.6 oz (110.9 kg)  ?Height: 5' 7.75" (1.721 m)  ? ?Physical Exam ?Constitutional:   ?   General: She is not in acute distress. ?Cardiovascular:  ?   Rate and Rhythm: Normal rate and regular rhythm.  ?   Heart sounds: Normal heart sounds.  ?Pulmonary:  ?   Effort: Pulmonary effort is normal.  ?   Breath sounds: Normal breath sounds.  ?Abdominal:  ?   General: Bowel sounds are normal.  ?   Palpations: Abdomen is soft.  ?Skin: ?   General: Skin is warm and dry.  ?Neurological:  ?   Mental Status: She is alert and oriented to person, place, and time.  ?  ?Breast exam: No palpable masses in bilateral breasts no palpable bilateral axillary adenopathy.  Bruising noted at the site of her recent breast biopsy ? ? ? ?  Latest Ref Rng & Units 07/21/2016  ?  3:46 AM  ?CMP  ?Glucose 65 - 99 mg/dL 122    ?BUN 6 - 20 mg/dL 13    ?Creatinine 0.44 - 1.00 mg/dL 0.82    ?Sodium 135 - 145 mmol/L 138    ?Potassium 3.5 - 5.1  mmol/L 3.7    ?Chloride 101 - 111 mmol/L 102    ?CO2 22 - 32 mmol/L 31    ?Calcium 8.9 - 10.3 mg/dL 8.4    ? ? ?  Latest Ref Rng & Units 07/21/2016  ?  3:46 AM  ?CBC  ?WBC 3.6 - 11.0 K/uL 6.7    ?Hemoglobin 12.0 - 16.0

## 2022-01-17 ENCOUNTER — Other Ambulatory Visit: Payer: Self-pay | Admitting: General Surgery

## 2022-01-17 ENCOUNTER — Ambulatory Visit: Payer: Self-pay | Admitting: General Surgery

## 2022-01-17 DIAGNOSIS — D0512 Intraductal carcinoma in situ of left breast: Secondary | ICD-10-CM

## 2022-01-17 NOTE — H&P (Signed)
PATIENT PROFILE: ?Tammy Griffin is a 61 y.o. 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 ? ?PCP:  Lawson Radar, NP ? ?HISTORY OF PRESENT ILLNESS: ?[name] reports had her usual screening mammogram.  On that screening mammogram she was found with a group of ossification in the left breast upper outer quadrant.  She had diagnostic mammogram that shows discussed occasions up to 8 mm.  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 ? ?Patient denies any previous palpable mass, skin changes, nipple retraction or nipple discharge. ? ?Family history of breast cancer: Positive for mother, sister, maternal grandmother, maternal aunt ?Family history of other cancers: Another maternal aunt with colon cancer ?Menarche: 76 ?Menopause: 40, hysterectomy due to fibroids ?Used OCP: Yes ?Used estrogen and progesterone therapy: None ?History of Radiation to the chest: Denies ?Previous breast biopsy: Right breast excisional biopsy (benign) ?Number of pregnancies: 5 (2 complete) ?Age of first pregnancy: 38 ? ? ?PROBLEM LIST: ?Problem List  Date Reviewed: 11/02/2021  ? ?       Noted  ? Onychomycosis of toenail 10/20/2021  ? Skin lesion of back 11/12/2019  ? Obesity (BMI 35.0-39.9 without comorbidity), unspecified 09/11/2019  ? OSA on CPAP 06/16/2019  ? Overview  ?  Dx 05/2019 ?  ?  ? Aortic atherosclerosis (CMS-HCC) 05/13/2019  ? Overview  ?  Found on Xray 05/13/2019 ?  ?  ? Low vitamin D level 11/13/2018  ? Low vitamin B12 level 11/13/2018  ? Renal function test abnormal 11/12/2018  ? History of Barrett's esophagus 10/28/2017  ? Overview  ?  Patient underwent EGD and colonoscopy on 11/02/2014 by Dr. Arther Dames revealing Barrett's esophagus on EGD, EGD repeat in 2019 with Dr. Alice Reichert negative for Barrett's, no suggested repeat.  ? ?  ?  ? Status post right hip replacement 10/28/2017  ? Bilateral carpal tunnel  syndrome 11/01/2015  ? Essential hypertension 03/29/2015  ? Gastroesophageal reflux disease 09/25/2014  ? Family history of malignant neoplasm of breast 08/25/2013  ? Right ovarian cyst 05/07/2013  ? Overview  ?  Overview:  ?2012 CT 3.3cm, cystic ?03/2012 CT 3.7 x 4.5cm, cystic ?Pelvic u/s done and ovaries non-visualized, plan repeat if pain recurs ? ?Formatting of this note might be different from the original. ?2012 CT 3.3cm, cystic ?03/2012 CT 3.7 x 4.5cm, cystic ?Pelvic u/s done and ovaries non-visualized, plan repeat if pain recurs ?  ?  ? Asthma 05/02/2013  ? Overview  ?  Overview:  ?Mild SOB, adult onset ?Bleb on CT - patient notes she had f/u with a Pulmonologist in [place] regarding this and did not require additional imaging.  ? ?Formatting of this note might be different from the original. ?Mild SOB, adult onset ?Bleb on CT ?  ?  ? History of breast lump 05/02/2013  ? Overview  ?  Overview:  ?Followed by Dr Ellamae Sia every 6 mos when in [place] ? ?Formatting of this note might be different from the original. ?Followed by Dr Ellamae Sia every 6 mos ?  ?  ? ? ?GENERAL REVIEW OF SYSTEMS:  ? ?General ROS: negative for - chills, fatigue, fever, weight gain or weight loss ?Allergy and Immunology ROS: negative for - hives  ?Hematological and Lymphatic ROS: negative for - bleeding problems or bruising, negative for palpable nodes ?Endocrine ROS: negative for - heat or cold intolerance, hair changes ?Respiratory ROS: negative for - cough, shortness  of breath or wheezing ?Cardiovascular ROS: no chest pain or palpitations ?GI ROS: negative for nausea, vomiting, abdominal pain, diarrhea, constipation ?Musculoskeletal ROS: negative for - joint swelling or muscle pain ?Neurological ROS: negative for - confusion, syncope ?Dermatological ROS: negative for pruritus and rash ?Psychiatric: negative for anxiety, depression, difficulty sleeping and memory loss ? ?MEDICATIONS: ?Current Outpatient Medications  ?Medication Sig Dispense Refill   ? amLODIPine-olmesartan (AZOR) 10-40 mg tablet TAKE 1 TABLET BY MOUTH EVERY DAY 90 tablet 0  ? BREO ELLIPTA 100-25 mcg/dose DsDv inhaler INHALE 1 PUFF INTO THE LUNGS ONCE DAILY 60 each 3  ? celecoxib (CELEBREX) 200 MG capsule TAKE 1 CAPSULE BY MOUTH ONCE DAILY. 30 capsule 8  ? cholecalciferol (VITAMIN D3) 5,000 unit capsule Take 1 capsule (5,000 Units total) by mouth once daily 360 capsule 11  ? ciclopirox (PENLAC) 8 % topical nail solution Apply 1 Application topically at bedtime 6.6 mL 3  ? cyanocobalamin (VITAMIN B12) 1000 MCG tablet Take 2 tablets daily for 2 weeks, then reduce to 1 tablet daily thereafter for Vitamin B12 Deficiency. 360 tablet 0  ? glucosam/chon-msm1/C/mang/bosw (OSTEO BI-FLEX TRIPLE STRENGTH ORAL) Take 2 tablets by mouth once daily    ? omeprazole (PRILOSEC) 20 MG DR capsule Take 1 capsule (20 mg total) by mouth 2 (two) times daily 180 capsule 3  ? terbinafine HCL (LAMISIL) 250 mg tablet Take 1 tablet (250 mg total) by mouth once daily 90 tablet 0  ? TURMERIC ORAL Take 400 mg by mouth once daily    ? albuterol (PROAIR RESPICLICK) 90 mcg/actuation inhaler Inhale 2 inhalations into the lungs every 4 (four) hours as needed for Wheezing or Shortness of Breath 1 each 1  ? aspirin 81 MG EC tablet Take 1 tablet (81 mg total) by mouth once daily (Patient not taking: Reported on 01/17/2022)    ? ?No current facility-administered medications for this visit.  ? ? ?ALLERGIES: ?Valium [diazepam] ? ?PAST MEDICAL HISTORY: ?Past Medical History:  ?Diagnosis Date  ? Abdominal hernia   ? Abnormal cytology   ? remote history in 20's, normal since  ? Allergic state   ? Anxiety   ? Asthma without status asthmaticus   ? Depression   ? External hemorrhoids 11/02/2014  ? GERD (gastroesophageal reflux disease)   ? HH (hiatus hernia) 11/02/2014  ? medium sized  ? Hyperlipidemia   ? Hyperplastic colon polyp 11/02/2014  ? Hypertension   ? Internal hemorrhoids 11/02/2014  ? Intestinal metaplasia of gastric mucosa 11/02/2014  ?  Osteoarthritis of multiple joints 09/25/2014  ? Primary osteoarthritis of left hip 11/12/2018  ? Schatzki's ring 11/02/2014  ? non-obstructing/Dilated  ? Tubular adenoma of colon 11/02/2014  ? ? ?PAST SURGICAL HISTORY: ?Past Surgical History:  ?Procedure Laterality Date  ? HYSTERECTOMY  2002  ? COLONOSCOPY  11/02/2014  ? tubular adenoma/Hyperplastic polyps/Repeat 48yr/MGR  ? EGD  11/02/2014  ? barrett's esophagus/intestinal metaplasia/repeat 3 years/MGR  ? COLONOSCOPY  07/03/2017  ? Tubular adenoma of the colon/Repeat 513yrTKT  ? ENDOSCOPIC CARPAL TUNNEL RELEASE Right 01/03/2021  ? Dr. MeRudene Christians? bladder sling    ? cyst removal from breast Right   ? DILATION AND CURETTAGE OF UTERUS    ? POLYPECTOMY    ? R Total Hip Arthroplasty, Anterior Approach 07/18/16 Dr MiHessie Knows  ?  ? ?FAMILY HISTORY: ?Family History  ?Problem Relation Age of Onset  ? Osteoarthritis Mother   ? Breast cancer Mother   ? Diabetes type II Father   ?  High blood pressure (Hypertension) Father   ? Prostate cancer Father   ? Breast cancer Sister   ? Diabetes type II Sister   ? Breast cancer Maternal Grandmother   ? Colon cancer Maternal Aunt   ?  ? ?SOCIAL HISTORY: ?Social History  ? ?Socioeconomic History  ? Marital status: Married  ?Tobacco Use  ? Smoking status: Former  ?  Years: 20.00  ?  Types: Cigarettes  ? Smokeless tobacco: Never  ?Vaping Use  ? Vaping Use: Never used  ?Substance and Sexual Activity  ? Alcohol use: No  ? Drug use: No  ? Sexual activity: Defer  ? ? ?PHYSICAL EXAM: ?There were no vitals filed for this visit. ?Body mass index is 38.22 kg/m?. ?Weight: (!) 110.7 kg (244 lb)  ? ?GENERAL: Alert, active, oriented x3 ? ?HEENT: Pupils equal reactive to light. Extraocular movements are intact. Sclera clear. Palpebral conjunctiva normal red color.Pharynx clear. ? ?NECK: Supple with no palpable mass and no adenopathy. ? ?LUNGS: Sound clear with no rales rhonchi or wheezes. ? ?HEART: Regular rhythm S1 and S2 without murmur. ? ?BREAST: breasts  appear normal, no suspicious masses, no skin or nipple changes or axillary nodes. ? ?ABDOMEN: Soft and depressible, nontender with no palpable mass, no hepatomegaly. ? ?EXTREMITIES: Well-developed well-n[place]

## 2022-01-17 NOTE — H&P (View-Only) (Signed)
PATIENT PROFILE: Tammy Griffin is a 61 y.o. 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  PCP:  Tammy Radar, NP  HISTORY OF PRESENT ILLNESS: Tammy Griffin reports had her usual screening mammogram.  On that screening mammogram she was found with a group of ossification in the left breast upper outer quadrant.  She had diagnostic mammogram that shows discussed occasions up to 8 mm.  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  Patient denies any previous palpable mass, skin changes, nipple retraction or nipple discharge.  Family history of breast cancer: Positive for mother, sister, maternal grandmother, maternal aunt Family history of other cancers: Another maternal aunt with colon cancer Menarche: 15 Menopause: 19, hysterectomy due to fibroids Used OCP: Yes Used estrogen and progesterone therapy: None History of Radiation to the chest: Denies Previous breast biopsy: Right breast excisional biopsy (benign) Number of pregnancies: 5 (2 complete) Age of first pregnancy: 19   PROBLEM LIST: Problem List  Date Reviewed: 11/02/2021          Noted   Onychomycosis of toenail 10/20/2021   Skin lesion of back 11/12/2019   Obesity (BMI 35.0-39.9 without comorbidity), unspecified 09/11/2019   OSA on CPAP 06/16/2019   Overview    Dx 05/2019      Aortic atherosclerosis (CMS-HCC) 05/13/2019   Overview    Found on Xray 05/13/2019      Low vitamin D level 11/13/2018   Low vitamin B12 level 11/13/2018   Renal function test abnormal 11/12/2018   History of Barrett's esophagus 10/28/2017   Overview    Patient underwent EGD and colonoscopy on 11/02/2014 by Dr. Arther Griffin revealing Barrett's esophagus on EGD, EGD repeat in 2019 with Dr. Alice Griffin negative for Barrett's, no suggested repeat.        Status post right hip replacement 10/28/2017   Bilateral carpal tunnel  syndrome 11/01/2015   Essential hypertension 03/29/2015   Gastroesophageal reflux disease 09/25/2014   Family history of malignant neoplasm of breast 08/25/2013   Right ovarian cyst 05/07/2013   Overview    Overview:  2012 CT 3.3cm, cystic 03/2012 CT 3.7 x 4.5cm, cystic Pelvic u/s done and ovaries non-visualized, plan repeat if pain recurs  Formatting of this note might be different from the original. 2012 CT 3.3cm, cystic 03/2012 CT 3.7 x 4.5cm, cystic Pelvic u/s done and ovaries non-visualized, plan repeat if pain recurs      Asthma 05/02/2013   Overview    Overview:  Mild SOB, adult onset Bleb on CT - patient notes she had f/u with a Pulmonologist in [place] regarding this and did not require additional imaging.   Formatting of this note might be different from the original. Mild SOB, adult onset Bleb on CT      History of breast lump 05/02/2013   Overview    Overview:  Followed by Dr Tammy Griffin every 6 mos when in [place]  Formatting of this note might be different from the original. Followed by Dr Tammy Griffin every 6 mos       GENERAL REVIEW OF SYSTEMS:   General ROS: negative for - chills, fatigue, fever, weight gain or weight loss Allergy and Immunology ROS: negative for - hives  Hematological and Lymphatic ROS: negative for - bleeding problems or bruising, negative for palpable nodes Endocrine ROS: negative for - heat or cold intolerance, hair changes Respiratory ROS: negative for - cough, shortness  of breath or wheezing Cardiovascular ROS: no chest pain or palpitations GI ROS: negative for nausea, vomiting, abdominal pain, diarrhea, constipation Musculoskeletal ROS: negative for - joint swelling or muscle pain Neurological ROS: negative for - confusion, syncope Dermatological ROS: negative for pruritus and rash Psychiatric: negative for anxiety, depression, difficulty sleeping and memory loss  MEDICATIONS: Current Outpatient Medications  Medication Sig Dispense Refill    amLODIPine-olmesartan (AZOR) 10-40 mg tablet TAKE 1 TABLET BY MOUTH EVERY DAY 90 tablet 0   BREO ELLIPTA 100-25 mcg/dose DsDv inhaler INHALE 1 PUFF INTO THE LUNGS ONCE DAILY 60 each 3   celecoxib (CELEBREX) 200 MG capsule TAKE 1 CAPSULE BY MOUTH ONCE DAILY. 30 capsule 8   cholecalciferol (VITAMIN D3) 5,000 unit capsule Take 1 capsule (5,000 Units total) by mouth once daily 360 capsule 11   ciclopirox (PENLAC) 8 % topical nail solution Apply 1 Application topically at bedtime 6.6 mL 3   cyanocobalamin (VITAMIN B12) 1000 MCG tablet Take 2 tablets daily for 2 weeks, then reduce to 1 tablet daily thereafter for Vitamin B12 Deficiency. 360 tablet 0   glucosam/chon-msm1/C/mang/bosw (OSTEO BI-FLEX TRIPLE STRENGTH ORAL) Take 2 tablets by mouth once daily     omeprazole (PRILOSEC) 20 MG DR capsule Take 1 capsule (20 mg total) by mouth 2 (two) times daily 180 capsule 3   terbinafine HCL (LAMISIL) 250 mg tablet Take 1 tablet (250 mg total) by mouth once daily 90 tablet 0   TURMERIC ORAL Take 400 mg by mouth once daily     albuterol (PROAIR RESPICLICK) 90 mcg/actuation inhaler Inhale 2 inhalations into the lungs every 4 (four) hours as needed for Wheezing or Shortness of Breath 1 each 1   aspirin 81 MG EC tablet Take 1 tablet (81 mg total) by mouth once daily (Patient not taking: Reported on 01/17/2022)     No current facility-administered medications for this visit.    ALLERGIES: Valium [diazepam]  PAST MEDICAL HISTORY: Past Medical History:  Diagnosis Date   Abdominal hernia    Abnormal cytology    remote history in 20's, normal since   Allergic state    Anxiety    Asthma without status asthmaticus    Depression    External hemorrhoids 11/02/2014   GERD (gastroesophageal reflux disease)    HH (hiatus hernia) 11/02/2014   medium sized   Hyperlipidemia    Hyperplastic colon polyp 11/02/2014   Hypertension    Internal hemorrhoids 11/02/2014   Intestinal metaplasia of gastric mucosa 11/02/2014    Osteoarthritis of multiple joints 09/25/2014   Primary osteoarthritis of left hip 11/12/2018   Schatzki's ring 11/02/2014   non-obstructing/Dilated   Tubular adenoma of colon 11/02/2014    PAST SURGICAL HISTORY: Past Surgical History:  Procedure Laterality Date   HYSTERECTOMY  2002   COLONOSCOPY  11/02/2014   tubular adenoma/Hyperplastic polyps/Repeat 33yr/MGR   EGD  11/02/2014   barrett's esophagus/intestinal metaplasia/repeat 3 years/MGR   COLONOSCOPY  07/03/2017   Tubular adenoma of the colon/Repeat 548yrTKT   ENDOSCOPIC CARPAL TUNNEL RELEASE Right 01/03/2021   Dr. MeRudene Christians bladder sling     cyst removal from breast Right    DILATION AND CURETTAGE OF UTERUS     POLYPECTOMY     R Total Hip Arthroplasty, Anterior Approach 07/18/16 Dr MiHessie Knows     FAMILY HISTORY: Family History  Problem Relation Age of Onset   Osteoarthritis Mother    Breast cancer Mother    Diabetes type II Father  High blood pressure (Hypertension) Father    Prostate cancer Father    Breast cancer Sister    Diabetes type II Sister    Breast cancer Maternal Grandmother    Colon cancer Maternal Aunt      SOCIAL HISTORY: Social History   Socioeconomic History   Marital status: Married  Tobacco Use   Smoking status: Former    Years: 20.00    Types: Cigarettes   Smokeless tobacco: Never  Vaping Use   Vaping Use: Never used  Substance and Sexual Activity   Alcohol use: No   Drug use: No   Sexual activity: Defer    PHYSICAL EXAM: There were no vitals filed for this visit. Body mass index is 38.22 kg/m. Weight: (!) 110.7 kg (244 lb)   GENERAL: Alert, active, oriented x3  HEENT: Pupils equal reactive to light. Extraocular movements are intact. Sclera clear. Palpebral conjunctiva normal red color.Pharynx clear.  NECK: Supple with no palpable mass and no adenopathy.  LUNGS: Sound clear with no rales rhonchi or wheezes.  HEART: Regular rhythm S1 and S2 without murmur.  BREAST: breasts  appear normal, no suspicious masses, no skin or nipple changes or axillary nodes.  ABDOMEN: Soft and depressible, nontender with no palpable mass, no hepatomegaly.  EXTREMITIES: Well-developed well-nourished symmetrical with no dependent edema.  NEUROLOGICAL: Awake alert oriented, facial expression symmetrical, moving all extremities.  REVIEW OF DATA: I have reviewed the following data today: No visits with results within 3 Month(s) from this visit.  Latest known visit with results is:  Ancillary Orders on 10/20/2021  Component Date Value   Sed Rate - [facility] 10/20/2021 2      ASSESSMENT: Ms. Dietzman is a 61 y.o. female presenting for consultation for left breast DCIS.    Patient was oriented again about the pathology results. Surgical alternatives were discussed with patient including partial vs total mastectomy. Surgical technique and post operative care was discussed with patient. Risk of surgery was discussed with patient including but not limited to: wound infection, seroma, hematoma, brachial plexopathy, mondor's disease (thrombosis of small veins of breast), chronic wound pain, breast lymphedema, altered sensation to the nipple and cosmesis among others.   Patient endorses that due to her strong family history of breast cancer and her sister was found with precancerous lesion on the contralateral breast she would like to proceed with bilateral total mastectomy without reconstruction.  She was again oriented about the option of partial mastectomy but she elected to proceed with total mastectomy.  Ductal carcinoma in situ (DCIS) of left breast [D05.12]Clinical stage from 01/16/2022: Stage 0 (cTis (DCIS), cN0, cM0, G2,  PLAN: Bilateral total mastectomy with left axillary sentinel needle biopsy ([id number] [id number]) CBC, CMP Avoid taking aspirin 5 days before the surgery Contact us if you have any question or concern  Patient and/or representative verbalized understanding, all  questions were answered, and were agreeable with the plan outlined above.     Herbert Pun, MD  Electronically signed by Herbert Pun, MD

## 2022-01-23 ENCOUNTER — Other Ambulatory Visit: Payer: Self-pay

## 2022-01-23 DIAGNOSIS — D0512 Intraductal carcinoma in situ of left breast: Secondary | ICD-10-CM

## 2022-02-01 ENCOUNTER — Encounter: Payer: Self-pay | Admitting: Urgent Care

## 2022-02-01 ENCOUNTER — Other Ambulatory Visit
Admission: RE | Admit: 2022-02-01 | Discharge: 2022-02-01 | Disposition: A | Discharge status: Home / Self Care | Payer: BC Managed Care – PPO | Source: Ambulatory Visit | Attending: General Surgery | Admitting: General Surgery

## 2022-02-01 ENCOUNTER — Other Ambulatory Visit: Payer: Self-pay

## 2022-02-01 DIAGNOSIS — Z01812 Encounter for preprocedural laboratory examination: Secondary | ICD-10-CM

## 2022-02-01 DIAGNOSIS — Z01810 Encounter for preprocedural cardiovascular examination: Secondary | ICD-10-CM | POA: Diagnosis not present

## 2022-02-01 DIAGNOSIS — Z01818 Encounter for other preprocedural examination: Secondary | ICD-10-CM | POA: Diagnosis present

## 2022-02-01 HISTORY — DX: Unspecified asthma, uncomplicated: J45.909

## 2022-02-01 NOTE — Patient Instructions (Signed)
Your procedure is scheduled on: 02/08/22 - Wednesday ?Report to the Registration Desk on the 1st floor of the [facility]. ?To find out your arrival time, please call [phone] between 1PM - 3PM on: 02/07/22 - Tuesday ?If your arrival time is 6:00 am, do not arrive prior to that time as the [facility] entrance doors do not open until 6:00 am. ? ?REMEMBER: ?Instructions that are not followed completely may result in serious medical risk, up to and including death; or upon the discretion of your surgeon and anesthesiologist your surgery may need to be rescheduled. ? ?Do not eat food or drink any fluids after midnight the night before surgery.  ?No gum chewing, lozengers or hard candies. ? ?TAKE THESE MEDICATIONS THE MORNING OF SURGERY WITH A SIP OF WATER: ? ?- celecoxib (CELEBREX) 200 MG capsule ?- fluticasone furoate-vilanterol (BREO ELLIPTA) 100-25 MCG/ACT AEPB ?- omeprazole (PRILOSEC) 20 MG capsule, (take one the night before and one on the morning of surgery - helps to prevent nausea after surgery.) ?- terbinafine (LAMISIL) 250 MG tablet ? ? ?One week prior to surgery: ?Stop Anti-inflammatories (NSAIDS) such as Advil, Aleve, Ibuprofen, Motrin, Naproxen, Naprosyn and Aspirin based products such as Excedrin, Goodys Powder, BC Powder. ? ?Stop ANY OVER THE COUNTER supplements until after surgery. ? ?You may however, continue to take Tylenol if needed for pain up until the day of surgery. ? ?No Alcohol for 24 hours before or after surgery. ? ?No Smoking including e-cigarettes for 24 hours prior to surgery.  ?No chewable tobacco products for at least 6 hours prior to surgery.  ?No nicotine patches on the day of surgery. ? ?Do not use any "recreational" drugs for at least a week prior to your surgery.  ?Please be advised that the combination of cocaine and anesthesia may have negative outcomes, up to and including death. ?If you test positive for cocaine, your surgery will be cancelled. ? ?On the morning of  surgery brush your teeth with toothpaste and water, you may rinse your mouth with mouthwash if you wish. ?Do not swallow any toothpaste or mouthwash. ? ?Use CHG Soap or wipes as directed on instruction sheet. ? ?Do not wear jewelry, make-up, hairpins, clips or nail polish. ? ?Do not wear lotions, powders, or perfumes.  ? ?Do not shave body from the neck down 48 hours prior to surgery just in case you cut yourself which could leave a site for infection.  ?Also, freshly shaved skin may become irritated if using the CHG soap. ? ?Contact lenses, hearing aids and dentures may not be worn into surgery. ? ?Do not bring valuables to the hospital. [facility] is not responsible for any missing/lost belongings or valuables.  ? ?Bring your C-PAP to the hospital with you in case you may have to spend the night.  ? ?Notify your doctor if there is any change in your medical condition (cold, fever, infection). ? ?Wear comfortable clothing (specific to your surgery type) to the hospital. ? ?After surgery, you can help prevent lung complications by doing breathing exercises.  ?Take deep breaths and cough every 1-2 hours. Your doctor may order a device called an Incentive Spirometer to help you take deep breaths. ?When coughing or sneezing, hold a pillow firmly against your incision with both hands. This is called ?splinting.? Doing this helps protect your incision. It also decreases belly discomfort. ? ?If you are being admitted to the hospital overnight, leave your suitcase in the car. ?After surgery it may be brought  to your room. ? ?If you are being discharged the day of surgery, you will not be allowed to drive home. ?You will need a responsible adult (18 years or older) to drive you home and stay with you that night.  ? ?If you are taking public transportation, you will need to have a responsible adult (18 years or older) with you. ?Please confirm with your physician that it is acceptable to use public transportation.   ? ?Please call the [facility] at [phone] if you have any questions about these instructions. ? ?Surgery Visitation Policy: ? ?Patients undergoing a surgery or procedure may have two family members or support persons with them as long as the person is not COVID-19 positive or experiencing its symptoms.  ? ?Inpatient Visitation:   ? ?Visiting hours are 7 a.m. to 8 p.m. ?Up to four visitors are allowed at one time in a patient room, including children. The visitors may rotate out with other people during the day. One designated support person (adult) may remain overnight.  ?

## 2022-02-07 ENCOUNTER — Inpatient Hospital Stay: Payer: BC Managed Care – PPO

## 2022-02-07 ENCOUNTER — Ambulatory Visit: Payer: BC Managed Care – PPO | Admitting: Oncology

## 2022-02-07 ENCOUNTER — Encounter: Payer: Self-pay | Admitting: Licensed Clinical Social Worker

## 2022-02-07 ENCOUNTER — Inpatient Hospital Stay: Payer: BC Managed Care – PPO | Admitting: Licensed Clinical Social Worker

## 2022-02-07 DIAGNOSIS — Z800 Family history of malignant neoplasm of digestive organs: Secondary | ICD-10-CM

## 2022-02-07 DIAGNOSIS — Z803 Family history of malignant neoplasm of breast: Secondary | ICD-10-CM

## 2022-02-07 DIAGNOSIS — D0512 Intraductal carcinoma in situ of left breast: Secondary | ICD-10-CM

## 2022-02-07 DIAGNOSIS — Z8042 Family history of malignant neoplasm of prostate: Secondary | ICD-10-CM

## 2022-02-07 MED ORDER — ORAL CARE MOUTH RINSE: 15.0000 mL | Freq: Once | OROMUCOSAL | Status: AC

## 2022-02-07 MED ORDER — CHLORHEXIDINE GLUCONATE 0.12 % MT SOLN
15.0000 mL | Freq: Once | OROMUCOSAL | Status: AC
  Administered 2022-02-08: 15 mL via OROMUCOSAL

## 2022-02-07 MED ORDER — CEFAZOLIN SODIUM-DEXTROSE 2-4 GM/100ML-% IV SOLN
2.0000 g | INTRAVENOUS | Status: AC
Start: 2022-02-08 — End: 2022-02-08
  Administered 2022-02-08: 2 g via INTRAVENOUS

## 2022-02-07 NOTE — Progress Notes (Signed)
REFERRING PROVIDER: Sindy Guadeloupe, MD [place],  [place] [id number]  PRIMARY PROVIDER:  Latanya Maudlin, NP  PRIMARY REASON FOR VISIT:  1. Breast neoplasm, Tis (DCIS), left   2. Family history of breast cancer      HISTORY OF PRESENT ILLNESS:   Ms. Lepage, a 61 y.o. female, was seen for a [facility] cancer genetics consultation at the request of Dr. Janese Banks due to a personal and family history of breast cancer.  Ms. Beane presents to clinic today to discuss the possibility of a hereditary predisposition to cancer, genetic testing, and to further clarify her future cancer risks, as well as potential cancer risks for family members.   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 The treatment plan includes bilateral mastectomy which she has planned for tomorrow, 02/08/2022.    CANCER HISTORY:  Oncology History  Breast neoplasm, Tis (DCIS), left  01/16/2022 Initial Diagnosis   Breast neoplasm, Tis (DCIS), left    01/16/2022 Cancer Staging   Staging form: Breast, AJCC 8th Edition - Clinical stage from 01/16/2022: Stage 0 (cTis (DCIS), cN0, cM0, G2, ER: Not Assessed, PR: Not Assessed, HER2: Not Assessed) - Signed by Sindy Guadeloupe, MD on 01/16/2022 Nuclear grade: G2 Histologic grading system: 3 grade system       RISK FACTORS:  Menarche was at age 22.  First live birth at age 25.  OCP use: remote Ovaries intact: yes.  Hysterectomy: yes. HRT use: 0 years. Colonoscopy: yes;  1 large polyp . Mammogram within the last year: yes. Number of breast biopsies: 2.  Past Medical History:  Diagnosis Date   Arthritis    Asthma    Cancer ([place]) 12/15/2019   basal cell ca   Carpal tunnel syndrome, bilateral    Dyspnea    with exertion; patient states it is because she is overweight   GERD (gastroesophageal reflux disease)    History of hiatal hernia    Hypertension    Neuropathy    Sleep apnea    uses cpap    Past Surgical  History:  Procedure Laterality Date   ABDOMINAL HYSTERECTOMY     bladder stretching     when she 30 -78 years old - bed wetting   BREAST BIOPSY Left 01/09/2022   Stereo Bx, Ribbon Clip, path pending   BREAST EXCISIONAL BIOPSY Right 03/01/2012   benign neoplast   BREAST SURGERY Right 03/01/2012   Lumpectomy   CARPAL TUNNEL RELEASE Right    COLONOSCOPY W/ POLYPECTOMY     INCONTINENCE SURGERY  2012   [facility] ARTHROSCOPY WITH MEDIAL MENISECTOMY Right 08/29/2019   Procedure: Arthroscopic Partial MEDIAL MENISCECTOMY;  Surgeon: Leim Fabry, MD;  Location: [facility];  Service: Orthopedics;  Laterality: Right;   TOTAL HIP ARTHROPLASTY Right 07/18/2016   Procedure: TOTAL HIP ARTHROPLASTY ANTERIOR APPROACH;  Surgeon: Hessie Knows, MD;  Location: [facility];  Service: Orthopedics;  Laterality: Right;   WISDOM TOOTH EXTRACTION      FAMILY HISTORY:  We obtained a detailed, 4-generation family history.  Significant diagnoses are listed below: Family History  Problem Relation Age of Onset   Breast cancer Mother 32   Diabetes Father    Hypertension Father    Prostate cancer Father        metatsatic; d. 41   Breast cancer Sister 78   Breast cancer Maternal Aunt  dx >50   Colon cancer Maternal Aunt        dx >50   Breast cancer Maternal Grandmother        dx 64s   Colon cancer Cousin        dx 36s   Ms. Stueve has 2 daughters and 1 son. She has 1 sister, 1 brother. Her sister had breast cancer at 66 and had genetic testing at that time that was negative (2007).   Ms. Rowland's mother had breast cancer at 49 and passed at 49. Patient had 4 maternal aunts, 4 uncles. One aunt had breast cancer, one aunt had colon cancer, both diagnosed over 24. An uncle had a possible cancer, unknown type. Possible cousin had breast cancer. Cousin had colon cancer in his 19s. Maternal grandmother had breast cancer and passed in her 76s. Grandfather  passed over 20.  Ms. Underberg's father had prostate cancer that was metastatic, he passed at 21. His father had lung cancer over 71, no other known cancers on this side of the family.  Ms. Renwick is unaware of previous family history of genetic testing for hereditary cancer risks.  There is no reported Ashkenazi Jewish ancestry. There is no known consanguinity.     GENETIC COUNSELING ASSESSMENT: Ms. Slezak is a 61 y.o. female with a personal and family history of breast cancer which is somewhat suggestive of a hereditary cancer syndrome and predisposition to cancer. We, therefore, discussed and recommended the following at today's visit.   DISCUSSION: We discussed that approximately 10% of breast cancer is hereditary. Most cases of hereditary breast cancer are associated with BRCA1/BRCA2 genes, although there are other genes associated with hereditary cancer as well. Cancers and risks are gene specific. We discussed that testing is beneficial for several reasons including knowing about cancer risks, identifying potential screening and risk-reduction options that may be appropriate, and to understand if other family members could be at risk for cancer and allow them to undergo genetic testing.   We reviewed the characteristics, features and inheritance patterns of hereditary cancer syndromes. We also discussed genetic testing, including the appropriate family members to test, the process of testing, insurance coverage and turn-around-time for results. We discussed the implications of a negative, positive and/or variant of uncertain significant result. We recommended Ms. Bucher pursue genetic testing for the [facility] CancerNext-Expanded+RNA gene panel.   Based on Ms. David's personal and family history of cancer, she meets medical criteria for genetic testing. Despite that she meets criteria, she may still have an out of pocket cost. We discussed that if her out of pocket cost for testing is over  $100, the laboratory will call and confirm whether she wants to proceed with testing.  If the out of pocket cost of testing is less than $100 she will be billed by the genetic testing laboratory.   PLAN: After considering the risks, benefits, and limitations, Ms. Bigbee provided informed consent to pursue genetic testing and the blood sample was sent to [facility] for analysis of the CancerNext-Expanded+RNA panel. Results should be available within approximately 2-3 weeks' time, at which point they will be disclosed by telephone to Ms. Esch, as will any additional recommendations warranted by these results. Ms. Alton will receive a summary of her genetic counseling visit and a copy of her results once available. This information will also be available in Epic.   Ms. Kuhl's questions were answered to her satisfaction today. Our contact information was provided should additional questions or concerns arise.  Thank you for the referral and allowing [place] to share in the care of your patient.   Faith Rogue, MS, [facility] Genetic Counselor [email or web address] Phone: [phone]  The patient was seen for a total of 30 minutes in face-to-face genetic counseling. Ms. Briney was seen with her husband, [name].  Dr. Grayland Ormond was available for discussion regarding this case.   _______________________________________________________________________ For Office Staff:  Number of people involved in session: 2 Was an Intern/ student involved with case: no

## 2022-02-07 NOTE — Research (Signed)
MTG-015 - Tissue and Bodily Fluids: Translational Medicine: Discovery and Evaluation of Biomarkers/Pharmacogenomics for the Diagnosis and Personalized Management of Patients    This Nurse has reviewed this patient's inclusion and exclusion criteria as a second review and confirms Tammy Griffin is eligible for study participation.  Patient may continue with enrollment.  [name], RN, BSN, [facility] She  Her  Hers Clinical Research Nurse [place] [phone]  Pager [phone] 02/07/2022 3:14 PM

## 2022-02-07 NOTE — Research (Signed)
Trial: MTG-015 3505-A Tissue and Bodily Fluids  Patient Tammy Griffin was identified by Jeral Fruit, RN as a potential candidate for the above listed study.  This Clinical Research Nurse met with ICESS BERTONI, [id number], on 02/07/22 in a manner and location that ensures patient privacy to discuss participation in the above listed research study.  Patient is Accompanied by her spouse, [name] .  A copy of the informed consent document and separate HIPAA Authorization was provided to the patient.  Patient reads, speaks, and understands [place].    Patient was provided with the business card of this Nurse and encouraged to contact the research team with any questions.  Patient was provided the option of taking informed consent documents home to review and was encouraged to review at their convenience with their support network, including other care providers. Patient is comfortable with making a decision regarding study participation today.  As outlined in the informed consent form, this Nurse and Ileana Ladd discussed the purpose of the research study, the investigational nature of the study, study procedures and requirements for study participation, potential risks and benefits of study participation, as well as alternatives to participation. This study is not blinded. The patient understands participation is voluntary and they may withdraw from study participation at any time.  This study does not involve randomization.  This study does not involve an investigational drug or device. This study does not involve a placebo. Patient understands enrollment is pending full eligibility review.   Confidentiality and how the patient's information will be used as part of study participation were discussed.  Patient was informed there is reimbursement provided for their time and effort spent on trial participation.  The patient is encouraged to discuss research study participation with their  insurance provider to determine what costs they may incur as part of study participation, including research related injury.    All questions were answered to patient's satisfaction.  The informed consent and separate HIPAA Authorization was reviewed page by page.  The patient's mental and emotional status is appropriate to provide informed consent, and the patient verbalizes an understanding of study participation.  Patient has agreed to participate in the above listed research study and has voluntarily signed the informed consent dated 08/08/2021 version #6  and separate HIPAA Authorization, version 5  on 02/07/22 at 237PM.  The patient was provided with a copy of the signed informed consent form and separate HIPAA Authorization for their reference.  No study specific procedures were obtained prior to the signing of the informed consent document.  Approximately 20 minutes were spent with the patient reviewing the informed consent documents.  After obtaining informed consent patient, voluntarily signed the optional Release of Information form for use throughout trial participation.  Patient was provided with her $48 [employer] gift card after her blood collection was completed by Mauricio Po, [place]. The patient denied any difficulties with the procedure. Research thanked the patient for her participation in clinical trials.  Jeral Fruit, RN 02/07/22 3:05 PM

## 2022-02-08 ENCOUNTER — Observation Stay
Admission: RE | Admit: 2022-02-08 | Discharge: 2022-02-09 | Disposition: A | Discharge status: Home / Self Care | Payer: BC Managed Care – PPO | Attending: General Surgery | Admitting: General Surgery

## 2022-02-08 ENCOUNTER — Other Ambulatory Visit: Payer: Self-pay

## 2022-02-08 ENCOUNTER — Encounter
Admission: RE | Disposition: A | Discharge status: Home / Self Care | Payer: Self-pay | Source: Home / Self Care | Attending: General Surgery

## 2022-02-08 ENCOUNTER — Ambulatory Visit
Admission: RE | Admit: 2022-02-08 | Discharge: 2022-02-08 | Disposition: A | Discharge status: Home / Self Care | Payer: BC Managed Care – PPO | Source: Ambulatory Visit | Attending: General Surgery | Admitting: General Surgery

## 2022-02-08 ENCOUNTER — Encounter: Payer: Self-pay | Admitting: General Surgery

## 2022-02-08 ENCOUNTER — Ambulatory Visit: Payer: BC Managed Care – PPO | Admitting: Certified Registered Nurse Anesthetist

## 2022-02-08 DIAGNOSIS — D0512 Intraductal carcinoma in situ of left breast: Secondary | ICD-10-CM | POA: Diagnosis present

## 2022-02-08 DIAGNOSIS — Z87891 Personal history of nicotine dependence: Secondary | ICD-10-CM | POA: Diagnosis not present

## 2022-02-08 DIAGNOSIS — Z96641 Presence of right artificial hip joint: Secondary | ICD-10-CM | POA: Diagnosis not present

## 2022-02-08 DIAGNOSIS — J45909 Unspecified asthma, uncomplicated: Secondary | ICD-10-CM | POA: Diagnosis not present

## 2022-02-08 DIAGNOSIS — C50919 Malignant neoplasm of unspecified site of unspecified female breast: Secondary | ICD-10-CM

## 2022-02-08 DIAGNOSIS — Z01812 Encounter for preprocedural laboratory examination: Secondary | ICD-10-CM

## 2022-02-08 DIAGNOSIS — Z7982 Long term (current) use of aspirin: Secondary | ICD-10-CM | POA: Insufficient documentation

## 2022-02-08 DIAGNOSIS — I10 Essential (primary) hypertension: Secondary | ICD-10-CM | POA: Insufficient documentation

## 2022-02-08 DIAGNOSIS — Z79899 Other long term (current) drug therapy: Secondary | ICD-10-CM | POA: Insufficient documentation

## 2022-02-08 DIAGNOSIS — D241 Benign neoplasm of right breast: Secondary | ICD-10-CM | POA: Insufficient documentation

## 2022-02-08 DIAGNOSIS — Z853 Personal history of malignant neoplasm of breast: Secondary | ICD-10-CM | POA: Insufficient documentation

## 2022-02-08 HISTORY — PX: TOTAL MASTECTOMY: SHX6129

## 2022-02-08 HISTORY — DX: Malignant neoplasm of unspecified site of unspecified female breast: C50.919

## 2022-02-08 SURGERY — MASTECTOMY, SIMPLE
Anesthesia: General | Site: Breast | Laterality: Bilateral

## 2022-02-08 MED ORDER — TECHNETIUM TC 99M TILMANOCEPT KIT
1.0000 | PACK | Freq: Once | INTRAVENOUS | Status: AC | PRN
  Administered 2022-02-08: 1 via INTRADERMAL

## 2022-02-08 MED ORDER — STERILE WATER FOR IRRIGATION IR SOLN
Status: DC | PRN
  Administered 2022-02-08: 1000 mL

## 2022-02-08 MED ORDER — METHOCARBAMOL 500 MG PO TABS
500.0000 mg | ORAL_TABLET | Freq: Four times a day (QID) | ORAL | Status: DC | PRN
  Administered 2022-02-08: 500 mg via ORAL

## 2022-02-08 MED ORDER — HYDROCODONE-ACETAMINOPHEN 5-325 MG PO TABS
ORAL_TABLET | ORAL | Status: AC
  Administered 2022-02-08: 1 via ORAL
  Filled 2022-02-08: qty 1

## 2022-02-08 MED ORDER — HYDROCODONE-ACETAMINOPHEN 5-325 MG PO TABS
1.0000 | ORAL_TABLET | ORAL | Status: DC | PRN
  Administered 2022-02-08: 2 via ORAL

## 2022-02-08 MED ORDER — MORPHINE SULFATE (PF) 4 MG/ML IV SOLN
INTRAVENOUS | Status: AC
  Administered 2022-02-08: 4 mg via INTRAVENOUS
  Filled 2022-02-08: qty 1

## 2022-02-08 MED ORDER — SODIUM CHLORIDE (PF) 0.9 % IJ SOLN
INTRAMUSCULAR | Status: DC | PRN
  Administered 2022-02-08: 100 mL

## 2022-02-08 MED ORDER — MIDAZOLAM HCL 2 MG/2ML IJ SOLN
INTRAMUSCULAR | Status: AC
Start: 2022-02-08 — End: ?
  Filled 2022-02-08: qty 2

## 2022-02-08 MED ORDER — PHENYLEPHRINE HCL (PRESSORS) 10 MG/ML IV SOLN
INTRAVENOUS | Status: DC | PRN
  Administered 2022-02-08: 80 ug via INTRAVENOUS
  Administered 2022-02-08: 120 ug via INTRAVENOUS
  Administered 2022-02-08: 80 ug via INTRAVENOUS

## 2022-02-08 MED ORDER — CEFAZOLIN SODIUM-DEXTROSE 2-4 GM/100ML-% IV SOLN
INTRAVENOUS | Status: AC
  Filled 2022-02-08: qty 100

## 2022-02-08 MED ORDER — CELECOXIB 200 MG PO CAPS
200.0000 mg | ORAL_CAPSULE | Freq: Every day | ORAL | Status: DC
Start: 2022-02-09 — End: 2022-02-09

## 2022-02-08 MED ORDER — SODIUM CHLORIDE 0.9 % IV SOLN: INTRAVENOUS | Status: DC

## 2022-02-08 MED ORDER — ACETAMINOPHEN 10 MG/ML IV SOLN
INTRAVENOUS | Status: AC
  Filled 2022-02-08: qty 100

## 2022-02-08 MED ORDER — DEXAMETHASONE SODIUM PHOSPHATE 10 MG/ML IJ SOLN
INTRAMUSCULAR | Status: DC | PRN
Start: 2022-02-08 — End: 2022-02-08
  Administered 2022-02-08: 8 mg via INTRAVENOUS

## 2022-02-08 MED ORDER — CELECOXIB 200 MG PO CAPS: 200.0000 mg | ORAL_CAPSULE | Freq: Every day | ORAL | Status: DC

## 2022-02-08 MED ORDER — ONDANSETRON HCL 4 MG/2ML IJ SOLN
INTRAMUSCULAR | Status: DC | PRN
  Administered 2022-02-08: 4 mg via INTRAVENOUS

## 2022-02-08 MED ORDER — BUPIVACAINE LIPOSOME 1.3 % IJ SUSP
INTRAMUSCULAR | Status: AC
  Filled 2022-02-08: qty 20

## 2022-02-08 MED ORDER — FLUTICASONE FUROATE-VILANTEROL 100-25 MCG/ACT IN AEPB
1.0000 | INHALATION_SPRAY | Freq: Every day | RESPIRATORY_TRACT | Status: DC
  Administered 2022-02-09: 1 via RESPIRATORY_TRACT
  Filled 2022-02-08: qty 28

## 2022-02-08 MED ORDER — PROPOFOL 10 MG/ML IV BOLUS
INTRAVENOUS | Status: DC | PRN
  Administered 2022-02-08: 100 mg via INTRAVENOUS
  Administered 2022-02-08: 200 mg via INTRAVENOUS
  Administered 2022-02-08: 30 mg via INTRAVENOUS

## 2022-02-08 MED ORDER — MORPHINE SULFATE (PF) 4 MG/ML IV SOLN: 4.0000 mg | INTRAVENOUS | Status: DC | PRN

## 2022-02-08 MED ORDER — FENTANYL CITRATE (PF) 100 MCG/2ML IJ SOLN
INTRAMUSCULAR | Status: AC
  Administered 2022-02-08: 25 ug via INTRAVENOUS
  Filled 2022-02-08: qty 2

## 2022-02-08 MED ORDER — FENTANYL CITRATE (PF) 100 MCG/2ML IJ SOLN
INTRAMUSCULAR | Status: DC | PRN
  Administered 2022-02-08 (×7): 25 ug via INTRAVENOUS

## 2022-02-08 MED ORDER — CHLORHEXIDINE GLUCONATE 0.12 % MT SOLN
OROMUCOSAL | Status: AC
  Filled 2022-02-08: qty 15

## 2022-02-08 MED ORDER — KETOROLAC TROMETHAMINE 30 MG/ML IJ SOLN
INTRAMUSCULAR | Status: DC | PRN
  Administered 2022-02-08: 30 mg via INTRAVENOUS

## 2022-02-08 MED ORDER — ACETAMINOPHEN 10 MG/ML IV SOLN
INTRAVENOUS | Status: DC | PRN
  Administered 2022-02-08: 1000 mg via INTRAVENOUS

## 2022-02-08 MED ORDER — ONDANSETRON HCL 4 MG/2ML IJ SOLN: 4.0000 mg | Freq: Four times a day (QID) | INTRAMUSCULAR | Status: DC | PRN

## 2022-02-08 MED ORDER — PANTOPRAZOLE SODIUM 40 MG PO TBEC
40.0000 mg | DELAYED_RELEASE_TABLET | Freq: Every day | ORAL | Status: DC
Start: 2022-02-09 — End: 2022-02-09

## 2022-02-08 MED ORDER — EPHEDRINE 5 MG/ML INJ
INTRAVENOUS | Status: AC
  Filled 2022-02-08: qty 5

## 2022-02-08 MED ORDER — HYDROCODONE-ACETAMINOPHEN 5-325 MG PO TABS
ORAL_TABLET | ORAL | Status: AC
  Filled 2022-02-08: qty 2

## 2022-02-08 MED ORDER — METHOCARBAMOL 500 MG PO TABS
ORAL_TABLET | ORAL | Status: AC
  Administered 2022-02-08: 500 mg via ORAL
  Filled 2022-02-08: qty 1

## 2022-02-08 MED ORDER — SODIUM CHLORIDE (PF) 0.9 % IJ SOLN
INTRAMUSCULAR | Status: AC
  Filled 2022-02-08: qty 50

## 2022-02-08 MED ORDER — AMLODIPINE BESYLATE 10 MG PO TABS
10.0000 mg | ORAL_TABLET | Freq: Every day | ORAL | Status: DC
  Administered 2022-02-09: 10 mg via ORAL
  Filled 2022-02-08: qty 1

## 2022-02-08 MED ORDER — KETOROLAC TROMETHAMINE 30 MG/ML IJ SOLN
INTRAMUSCULAR | Status: AC
  Filled 2022-02-08: qty 1

## 2022-02-08 MED ORDER — DEXAMETHASONE SODIUM PHOSPHATE 10 MG/ML IJ SOLN
INTRAMUSCULAR | Status: AC
  Filled 2022-02-08: qty 1

## 2022-02-08 MED ORDER — TERBINAFINE HCL 250 MG PO TABS
250.0000 mg | ORAL_TABLET | Freq: Every day | ORAL | Status: DC
  Filled 2022-02-08: qty 1

## 2022-02-08 MED ORDER — ONDANSETRON HCL 4 MG/2ML IJ SOLN
INTRAMUSCULAR | Status: AC
  Filled 2022-02-08: qty 2

## 2022-02-08 MED ORDER — ENOXAPARIN SODIUM 40 MG/0.4ML IJ SOSY: 40.0000 mg | PREFILLED_SYRINGE | INTRAMUSCULAR | Status: DC

## 2022-02-08 MED ORDER — FENTANYL CITRATE (PF) 100 MCG/2ML IJ SOLN
INTRAMUSCULAR | Status: AC
  Filled 2022-02-08: qty 2

## 2022-02-08 MED ORDER — LIDOCAINE HCL (CARDIAC) PF 100 MG/5ML IV SOSY
PREFILLED_SYRINGE | INTRAVENOUS | Status: DC | PRN
  Administered 2022-02-08: 100 mg via INTRAVENOUS

## 2022-02-08 MED ORDER — IRBESARTAN 150 MG PO TABS
300.0000 mg | ORAL_TABLET | Freq: Every day | ORAL | Status: DC
  Administered 2022-02-09: 300 mg via ORAL
  Filled 2022-02-08: qty 2

## 2022-02-08 MED ORDER — ONDANSETRON 4 MG PO TBDP: 4.0000 mg | ORAL_TABLET | Freq: Four times a day (QID) | ORAL | Status: DC | PRN

## 2022-02-08 MED ORDER — FENTANYL CITRATE (PF) 100 MCG/2ML IJ SOLN
25.0000 ug | INTRAMUSCULAR | Status: DC | PRN
  Administered 2022-02-08: 50 ug via INTRAVENOUS
  Administered 2022-02-08: 25 ug via INTRAVENOUS

## 2022-02-08 MED ORDER — MIDAZOLAM HCL 2 MG/2ML IJ SOLN
INTRAMUSCULAR | Status: DC | PRN
Start: 2022-02-08 — End: 2022-02-08
  Administered 2022-02-08: 2 mg via INTRAVENOUS

## 2022-02-08 MED ORDER — HYDROMORPHONE HCL 1 MG/ML IJ SOLN
INTRAMUSCULAR | Status: DC | PRN
  Administered 2022-02-08 (×2): .5 mg via INTRAVENOUS

## 2022-02-08 MED ORDER — PROPOFOL 10 MG/ML IV BOLUS
INTRAVENOUS | Status: AC
  Filled 2022-02-08: qty 20

## 2022-02-08 MED ORDER — METHOCARBAMOL 500 MG PO TABS
ORAL_TABLET | ORAL | Status: AC
  Filled 2022-02-08: qty 1

## 2022-02-08 MED ORDER — ONDANSETRON HCL 4 MG/2ML IJ SOLN
INTRAMUSCULAR | Status: AC
  Administered 2022-02-08: 4 mg via INTRAVENOUS
  Filled 2022-02-08: qty 2

## 2022-02-08 MED ORDER — EPHEDRINE SULFATE (PRESSORS) 50 MG/ML IJ SOLN
INTRAMUSCULAR | Status: DC | PRN
Start: 2022-02-08 — End: 2022-02-08
  Administered 2022-02-08 (×4): 5 mg via INTRAVENOUS

## 2022-02-08 MED ORDER — PROMETHAZINE HCL 25 MG/ML IJ SOLN: 6.2500 mg | INTRAMUSCULAR | Status: DC | PRN

## 2022-02-08 MED ORDER — HYDROMORPHONE HCL 1 MG/ML IJ SOLN
INTRAMUSCULAR | Status: AC
  Filled 2022-02-08: qty 1

## 2022-02-08 MED ORDER — GLYCOPYRROLATE 0.2 MG/ML IJ SOLN
INTRAMUSCULAR | Status: DC | PRN
  Administered 2022-02-08: .2 mg via INTRAVENOUS

## 2022-02-08 MED ORDER — AMLODIPINE-OLMESARTAN 10-40 MG PO TABS: 1.0000 | ORAL_TABLET | Freq: Every day | ORAL | Status: DC

## 2022-02-08 MED ORDER — LACTATED RINGERS IV SOLN
INTRAVENOUS | Status: DC
Start: 2022-02-08 — End: 2022-02-09

## 2022-02-08 SURGICAL SUPPLY — 52 items
APPLIER CLIP 11 MED OPEN (CLIP)
APPLIER CLIP 9.375 SM OPEN (CLIP)
BINDER BREAST XLRG (GAUZE/BANDAGES/DRESSINGS) ×1 IMPLANT
BLADE SURG 15 STRL LF DISP TIS (BLADE) ×1 IMPLANT
BLADE SURG 15 STRL SS (BLADE) ×1
BULB RESERV EVAC DRAIN JP 100C (MISCELLANEOUS) ×2 IMPLANT
CHLORAPREP W/TINT 26 (MISCELLANEOUS) ×3 IMPLANT
CLIP APPLIE 11 MED OPEN (CLIP) IMPLANT
CLIP APPLIE 9.375 SM OPEN (CLIP) IMPLANT
CNTNR SPEC 2.5X3XGRAD LEK (MISCELLANEOUS) ×1
CONT SPEC 4OZ STER OR WHT (MISCELLANEOUS) ×1
CONTAINER SPEC 2.5X3XGRAD LEK (MISCELLANEOUS) ×1 IMPLANT
DERMABOND ADVANCED (GAUZE/BANDAGES/DRESSINGS) ×2
DERMABOND ADVANCED .7 DNX12 (GAUZE/BANDAGES/DRESSINGS) ×2 IMPLANT
DEVICE DUBIN SPECIMEN MAMMOGRA (MISCELLANEOUS) ×1 IMPLANT
DRAIN JP 15F RND TROCAR (DRAIN) ×1 IMPLANT
DRAPE LAPAROTOMY TRNSV 106X77 (MISCELLANEOUS) ×2 IMPLANT
DRSG GAUZE FLUFF 36X18 (GAUZE/BANDAGES/DRESSINGS) ×3 IMPLANT
ELECT BLADE 6.5 EXT (BLADE) ×2 IMPLANT
ELECT REM PT RETURN 9FT ADLT (ELECTROSURGICAL) ×2
ELECTRODE REM PT RTRN 9FT ADLT (ELECTROSURGICAL) ×1 IMPLANT
GAUZE 4X4 16PLY ~~LOC~~+RFID DBL (SPONGE) ×2 IMPLANT
GAUZE SPONGE 4X4 12PLY STRL (GAUZE/BANDAGES/DRESSINGS) ×2 IMPLANT
GLOVE BIO SURGEON STRL SZ 6.5 (GLOVE) ×4 IMPLANT
GLOVE BIOGEL PI IND STRL 6.5 (GLOVE) ×1 IMPLANT
GLOVE BIOGEL PI INDICATOR 6.5 (GLOVE) ×1
GOWN STRL REUS W/ TWL LRG LVL3 (GOWN DISPOSABLE) ×2 IMPLANT
GOWN STRL REUS W/TWL LRG LVL3 (GOWN DISPOSABLE) ×2
JACKSON PRATT 10 (INSTRUMENTS) ×2 IMPLANT
KIT TURNOVER KIT A (KITS) ×2 IMPLANT
LABEL OR SOLS (LABEL) ×2 IMPLANT
MANIFOLD NEPTUNE II (INSTRUMENTS) ×2 IMPLANT
NEEDLE HYPO 22GX1.5 SAFETY (NEEDLE) ×2 IMPLANT
PACK BASIN MAJOR ARMC (MISCELLANEOUS) ×2 IMPLANT
PENCIL SMOKE EVAC ROCKERSWITCH (MISCELLANEOUS) ×1 IMPLANT
SLEVE PROBE SENORX GAMMA FIND (MISCELLANEOUS) ×2 IMPLANT
SPONGE T-LAP 18X18 ~~LOC~~+RFID (SPONGE) ×4 IMPLANT
SUT ETHILON 3-0 FS-10 30 BLK (SUTURE) ×4
SUT MNCRL 4-0 (SUTURE) ×2
SUT MNCRL 4-0 27XMFL (SUTURE) ×2
SUT SILK 2 0 SH (SUTURE) ×2 IMPLANT
SUT SILK 3-0 (SUTURE) ×2 IMPLANT
SUT VIC AB 3-0 54X BRD REEL (SUTURE) ×1 IMPLANT
SUT VIC AB 3-0 BRD 54 (SUTURE) ×1
SUT VIC AB 3-0 SH 27 (SUTURE)
SUT VIC AB 3-0 SH 27X BRD (SUTURE) IMPLANT
SUT VIC AB 3-0 SH 8-18 (SUTURE) ×2 IMPLANT
SUTURE EHLN 3-0 FS-10 30 BLK (SUTURE) ×1 IMPLANT
SUTURE MNCRL 4-0 27XMF (SUTURE) ×2 IMPLANT
SYR BULB IRRIG 60ML STRL (SYRINGE) ×2 IMPLANT
WATER STERILE IRR 1000ML POUR (IV SOLUTION) ×2 IMPLANT
WATER STERILE IRR 500ML POUR (IV SOLUTION) ×2 IMPLANT

## 2022-02-08 NOTE — Anesthesia Preprocedure Evaluation (Signed)
Anesthesia Evaluation  Patient identified by MRN, date of birth, ID band Patient awake    Reviewed: Allergy & Precautions, NPO status , Patient's Chart, lab work & pertinent test results, reviewed documented beta blocker date and time   History of Anesthesia Complications Negative for: history of anesthetic complications  Airway Mallampati: III  TM Distance: >3 FB     Dental  (+) Chipped, Dental Advidsory Given   Pulmonary neg shortness of breath, asthma , sleep apnea , neg recent URI, former smoker,           Cardiovascular Exercise Tolerance: Good hypertension, Pt. on medications (-) angina(-) Past MI and (-) Cardiac Stents (-) dysrhythmias (-) Valvular Problems/Murmurs     Neuro/Psych neg Seizures  Neuromuscular disease negative psych ROS   GI/Hepatic Neg liver ROS, hiatal hernia, GERD  Controlled,  Endo/Other  neg diabetesMorbid obesity  Renal/GU negative Renal ROS     Musculoskeletal  (+) Arthritis ,   Abdominal   Peds  Hematology   Anesthesia Other Findings Past Medical History: No date: Arthritis No date: Asthma 12/15/2019: Cancer ([place])     Comment:  basal cell ca No date: Carpal tunnel syndrome, bilateral No date: Dyspnea     Comment:  with exertion; patient states it is because she is               overweight No date: GERD (gastroesophageal reflux disease) No date: History of hiatal hernia No date: Hypertension No date: Neuropathy No date: Sleep apnea     Comment:  uses cpap   Reproductive/Obstetrics                             Anesthesia Physical  Anesthesia Plan  ASA: 3  Anesthesia Plan: General   Post-op Pain Management:    Induction: Intravenous  PONV Risk Score and Plan: 3 and Ondansetron, Dexamethasone, Midazolam, Treatment may vary due to age or medical condition and Promethazine  Airway Management Planned: LMA  Additional Equipment:   Intra-op Plan:    Post-operative Plan: Extubation in OR  Informed Consent: I have reviewed the patients History and Physical, chart, labs and discussed the procedure including the risks, benefits and alternatives for the proposed anesthesia with the patient or authorized representative who has indicated his/her understanding and acceptance.       Plan Discussed with: CRNA  Anesthesia Plan Comments:         Anesthesia Quick Evaluation

## 2022-02-08 NOTE — Anesthesia Postprocedure Evaluation (Signed)
Anesthesia Post Note  Patient: Tammy Griffin  Procedure(s) Performed: TOTAL MASTECTOMY- LEFT AXILLARY SENTINEL NODE BIOPSY (Bilateral: Breast)  Patient location during evaluation: PACU Anesthesia Type: General Level of consciousness: awake and alert Pain management: pain level controlled Vital Signs Assessment: post-procedure vital signs reviewed and stable Respiratory status: spontaneous breathing, nonlabored ventilation, respiratory function stable and patient connected to nasal cannula oxygen Cardiovascular status: blood pressure returned to baseline and stable Postop Assessment: no apparent nausea or vomiting Anesthetic complications: no   No notable events documented.   Last Vitals:  Vitals:   02/08/22 1430 02/08/22 1506  BP: 125/69 119/62  Pulse: 76 80  Resp: 13 16  Temp: (!) 36.4 C 36.6 C  SpO2: 96% 98%    Last Pain:  Vitals:   02/08/22 1506  TempSrc:   PainSc: 1                  Martha Clan

## 2022-02-08 NOTE — Plan of Care (Signed)
Reviewed with patient, verbalizes understanding  

## 2022-02-08 NOTE — Transfer of Care (Signed)
Immediate Anesthesia Transfer of Care Note  Patient: Tammy Griffin  Procedure(s) Performed: TOTAL MASTECTOMY- LEFT AXILLARY SENTINEL NODE BIOPSY (Bilateral: Breast)  Patient Location: PACU  Anesthesia Type:General  Level of Consciousness: awake  Airway & Oxygen Therapy: Patient Spontanous Breathing and Patient connected to face mask oxygen  Post-op Assessment: Report given to RN and Post -op Vital signs reviewed and stable  Post vital signs: Reviewed and stable  Last Vitals:  Vitals Value Taken Time  BP 115/62   Temp    Pulse 78 02/08/22 1328  Resp 14 02/08/22 1328  SpO2 96 % 02/08/22 1328  Vitals shown include unvalidated device data.  Last Pain:  Vitals:   02/08/22 0829  TempSrc: Oral  PainSc: 0-No pain         Complications: No notable events documented.

## 2022-02-08 NOTE — Anesthesia Procedure Notes (Signed)
Procedure Name: LMA Insertion Date/Time: 02/08/2022 9:15 AM Performed by: Demetrius Charity, CRNA Pre-anesthesia Checklist: Patient identified, Patient being monitored, Timeout performed, Emergency Drugs available and Suction available Patient Re-evaluated:Patient Re-evaluated prior to induction Oxygen Delivery Method: Circle system utilized Preoxygenation: Pre-oxygenation with 100% oxygen Induction Type: IV induction Ventilation: Mask ventilation without difficulty LMA: LMA inserted LMA Size: 3.5 Tube type: Oral Number of attempts: 1 Placement Confirmation: positive ETCO2 and breath sounds checked- equal and bilateral Tube secured with: Tape Dental Injury: Teeth and Oropharynx as per pre-operative assessment

## 2022-02-08 NOTE — Op Note (Signed)
Preoperative diagnosis: Ductal Carcinoma in situ of the left breast.  Postoperative diagnosis: Same.   Procedure: Bilateral total mastectomy.                     Left axillary Sentinel Lymph node biopsy  Anesthesia: GETA  Surgeon: Dr. Windell Moment  Wound Classification: Clean  Indications: Patient is a 61 y.o. female who had an abnormal mammogram that on workup with core needle biopsy was found to be ductal carcinoma in situ. After discussion of alternatives, the patient elected bilateral total mastectomy.  Axillary sentinel node biopsy indicated for staging.  Findings: 1.  No palpable masses 2.  No abnormal axillary lymph nodes palpated 3.  1 sentinel lymph identified 4.  Adequate hemostasis.  Description of procedure: The patient was brought to the operating room and general anesthesia was induced. A time-out was completed verifying correct patient, procedure, site, positioning, and implant(s) and/or special equipment prior to beginning this procedure.  Bilateral breast, chest wall, axilla, and upper arm and neck were prepped and draped in the usual sterile fashion.  Mastectomy of the left side was done first using the following technique.  A skin incision was made that encompassed the nipple-areola complex and the previous biopsy scar and passed in an oblique direction across the breast. Flaps were raised in the avascular plane between subcutaneous tissue and breast tissue from the clavicle superiorly, the sternum medially, the anterior rectus sheath inferiorly, and past the lateral border of the pectoralis major muscle laterally. Hemostasis was achieved in the flaps. Next, the breast tissue and underlying pectoralis fascia were excised from the pectoralis major muscle, progressing from medially to laterally. At the lateral border of the pectoralis major muscle, the breast tissue was swung laterally and a lateral pedicle identified where breast tissue gave way to fat of axilla. The lateral  pedicle was incised and the specimen removed.   A hand-held gamma probe was used to identify the location of the hottest spot in the axilla. The point of maximal count was found. Dissection continue until nodule was identified. The probe was placed in contact with the node. The node was excised in its entirety.  No additional hot spots were identified. No clinically abnormal nodes were palpated.   Then I proceeded to do the right total mastectomy.  Right mastectomy was done using the same technique as the left total mastectomy.  No sentinel node biopsy was done on the right axillary area.  There was no palpable abnormality.  The wounds were irrigated and hemostasis was achieved. Closed suction drains (1 on each breast) were brought into the operative field through a separate stab incision and sutured to the skin with a 2-0 nylon suture. The wound was closed with interrupted 3-0 Vicryl to the subcutaneous layer, followed by a subcuticular layer of Monocryl 4-0. The wound was dressed.  The patient tolerated the procedure well and was taken to the postanesthesia care unit in stable condition.     Sentinel Node Biopsy Synoptic Operative Report  Operation performed with curative intent:Yes  Tracer(s) used to identify sentinel nodes in the upfront surgery (non-neoadjuvant) setting (select all that apply):Radioactive Tracer  Tracer(s) used to identify sentinel nodes in the neoadjuvant setting (select all that apply):N/A  All nodes (colored or non-colored) present at the end of a dye-filled lymphatic channel were removed:N/A  All significantly radioactive nodes were removed:Yes  All palpable suspicious nodes were removed:N/A  Biopsy-proven positive nodes marked with clips prior to chemotherapy were identified  and removed:N/A  Specimen: Left Breast                    Left axillary Sentinel lymph nodes.                     Right breast  Complications: None  Estimated Blood Loss: 50 mL

## 2022-02-08 NOTE — Interval H&P Note (Signed)
History and Physical Interval Note:  02/08/2022 9:08 AM  Tammy Griffin  has presented today for surgery, with the diagnosis of D05.12 DCIS, Lt breast.  The various methods of treatment have been discussed with the patient and family. After consideration of risks, benefits and other options for treatment, the patient has consented to  Procedure(s): TOTAL MASTECTOMY bilateral with LEFT AXILLARY SENTINEL NODE BIOPSY as a surgical intervention.  The patient's history has been reviewed, patient examined, no change in status, stable for surgery.  I have reviewed the patient's chart and labs.  Questions were answered to the patient's satisfaction.     Herbert Pun

## 2022-02-09 ENCOUNTER — Encounter: Payer: Self-pay | Admitting: General Surgery

## 2022-02-09 DIAGNOSIS — D0512 Intraductal carcinoma in situ of left breast: Secondary | ICD-10-CM | POA: Diagnosis not present

## 2022-02-09 LAB — BASIC METABOLIC PANEL
Anion gap: 7 (ref 5–15)
BUN: 17 mg/dL (ref 6–20)
CO2: 26 mmol/L (ref 22–32)
Calcium: 8.5 mg/dL — ABNORMAL LOW (ref 8.9–10.3)
Chloride: 106 mmol/L (ref 98–111)
Creatinine, Ser: 0.88 mg/dL (ref 0.44–1.00)
GFR, Estimated: >60 mL/min (ref >60)
Glucose, Bld: 120 mg/dL — ABNORMAL HIGH (ref 70–99)
Potassium: 4.3 mmol/L (ref 3.5–5.1)
Sodium: 139 mmol/L (ref 135–145)

## 2022-02-09 LAB — CBC
HCT: 34.9 % — ABNORMAL LOW (ref 36.0–46.0)
Hemoglobin: 11.7 g/dL — ABNORMAL LOW (ref 12.0–15.0)
MCH: 31.5 pg (ref 26.0–34.0)
MCHC: 33.5 g/dL (ref 30.0–36.0)
MCV: 93.8 fL (ref 80.0–100.0)
Platelets: 231 10*3/uL (ref 150–400)
RBC: 3.72 MIL/uL — ABNORMAL LOW (ref 3.87–5.11)
RDW: 12.5 % (ref 11.5–15.5)
WBC: 8.4 10*3/uL (ref 4.0–10.5)
nRBC: 0 % (ref 0.0–0.2)

## 2022-02-09 MED ORDER — OXYCODONE-ACETAMINOPHEN 5-325 MG PO TABS: 1.0000 | ORAL_TABLET | ORAL | 0 refills | Status: AC | PRN

## 2022-02-09 MED ORDER — METHOCARBAMOL 500 MG PO TABS
ORAL_TABLET | ORAL | Status: AC
Start: 2022-02-09 — End: 2022-02-09
  Administered 2022-02-09: 500 mg via ORAL
  Filled 2022-02-09: qty 1

## 2022-02-09 MED ORDER — ENOXAPARIN SODIUM 40 MG/0.4ML IJ SOSY
PREFILLED_SYRINGE | INTRAMUSCULAR | Status: AC
Start: 2022-02-09 — End: 2022-02-09
  Administered 2022-02-09: 40 mg via SUBCUTANEOUS
  Filled 2022-02-09: qty 0.4

## 2022-02-09 MED ORDER — CELECOXIB 200 MG PO CAPS
ORAL_CAPSULE | ORAL | Status: AC
  Administered 2022-02-09: 200 mg via ORAL
  Filled 2022-02-09: qty 1

## 2022-02-09 MED ORDER — HYDROCODONE-ACETAMINOPHEN 5-325 MG PO TABS
ORAL_TABLET | ORAL | Status: AC
  Administered 2022-02-09: 2
  Filled 2022-02-09: qty 2

## 2022-02-09 MED ORDER — METHOCARBAMOL 500 MG PO TABS: 500.0000 mg | ORAL_TABLET | Freq: Three times a day (TID) | ORAL | 0 refills | Status: AC

## 2022-02-09 MED ORDER — HYDROCODONE-ACETAMINOPHEN 5-325 MG PO TABS
ORAL_TABLET | ORAL | Status: AC
  Administered 2022-02-09: 1 via ORAL
  Filled 2022-02-09: qty 1

## 2022-02-09 MED ORDER — PANTOPRAZOLE SODIUM 40 MG PO TBEC
DELAYED_RELEASE_TABLET | ORAL | Status: AC
Start: 2022-02-09 — End: 2022-02-09
  Administered 2022-02-09: 40 mg via ORAL
  Filled 2022-02-09: qty 1

## 2022-02-09 MED ORDER — METHOCARBAMOL 500 MG PO TABS
ORAL_TABLET | ORAL | Status: AC
  Administered 2022-02-09: 500 mg via ORAL
  Filled 2022-02-09: qty 1

## 2022-02-09 NOTE — Discharge Summary (Signed)
Patient ID: Tammy Griffin MRN: [id number] DOB/AGE: 1960-09-24 61 y.o.  Admit date: 02/08/2022 Discharge date: 02/09/2022   Discharge Diagnoses:  Principal Problem:   Breast cancer [facility])   Procedures: Bilateral mastectomy with left axillary sentinel lymph node biopsy  Hospital Course: Patient admitted for surgical management of breast cancer.  She had bilateral mastectomy and left-sided axillary sentinel lymph node biopsy.  She tolerated the procedure well.  This morning patient with pain control.  Patient oriented how to take care of the drains.  Patient ambulating.  Patient tolerating diet.  Physical Exam HENT:     Head: Normocephalic.  Cardiovascular:     Rate and Rhythm: Normal rate and regular rhythm.     Pulses: Normal pulses.  Pulmonary:     Effort: Pulmonary effort is normal.  Chest:  Breasts:    Right: Absent.     Left: Absent.     Comments: Healthy while flat without ischemic tissue.  No fluid collection.  No bruises or hematoma Abdominal:     General: Abdomen is flat.  Musculoskeletal:        General: Normal range of motion.     Cervical back: Normal range of motion.  Skin:    General: Skin is warm.     Capillary Refill: Capillary refill takes less than 2 seconds.  Neurological:     Mental Status: She is alert and oriented to person, place, and time.     Consults: None  Disposition: Discharge disposition: 01-Home or Self Care       Discharge Instructions     Diet - low sodium heart healthy   Complete by: As directed    Increase activity slowly   Complete by: As directed       Allergies as of 02/09/2022       Reactions   Valium [diazepam] Swelling   via IV        Medication List     TAKE these medications    acetaminophen 500 MG tablet Commonly known as: TYLENOL Take 1,000 mg by mouth every 8 (eight) hours as needed.   amLODipine-olmesartan 10-40 MG tablet Commonly known as: AZOR Take 1 tablet by mouth daily.   celecoxib  200 MG capsule Commonly known as: CELEBREX Take 200 mg by mouth daily.   fluticasone furoate-vilanterol 100-25 MCG/ACT Aepb Commonly known as: BREO ELLIPTA Inhale 1 puff into the lungs daily.   HYDROcodone-acetaminophen 5-325 MG tablet Commonly known as: Norco Take 1-2 tablets by mouth every 4 (four) hours as needed for moderate pain or severe pain.   methocarbamol 500 MG tablet Commonly known as: ROBAXIN Take 1 tablet (500 mg total) by mouth 3 (three) times daily for 7 days.   omeprazole 20 MG capsule Commonly known as: PRILOSEC Take 20 mg by mouth 2 (two) times daily before a meal.   ondansetron 4 MG disintegrating tablet Commonly known as: Zofran ODT Take 1 tablet (4 mg total) by mouth every 8 (eight) hours as needed for nausea or vomiting.   oxyCODONE-acetaminophen 5-325 MG tablet Commonly known as: Percocet Take 1 tablet by mouth every 4 (four) hours as needed for severe pain.   terbinafine 250 MG tablet Commonly known as: LAMISIL Take 250 mg by mouth daily.   Vitamin D3 250 MCG (10000 UT) capsule Take 10,000 Units by mouth daily.        Follow-up Information     Herbert Pun, MD Follow up in 1 week(s).   Specialty: General Surgery Contact information: [id number]  [place] [address] [phone]

## 2022-02-09 NOTE — Discharge Instructions (Signed)

## 2022-02-15 ENCOUNTER — Other Ambulatory Visit: Payer: Self-pay | Admitting: Anatomic Pathology & Clinical Pathology

## 2022-02-15 LAB — SURGICAL PATHOLOGY

## 2022-02-16 LAB — SURGICAL PATHOLOGY

## 2022-02-17 ENCOUNTER — Inpatient Hospital Stay: Payer: BC Managed Care – PPO | Attending: Oncology | Admitting: Oncology

## 2022-02-17 ENCOUNTER — Encounter: Payer: Self-pay | Admitting: Oncology

## 2022-02-17 VITALS — BP 121/58 | HR 72 | Temp 98.1°F | Resp 20 | Wt 240.9 lb

## 2022-02-17 DIAGNOSIS — Z8042 Family history of malignant neoplasm of prostate: Secondary | ICD-10-CM | POA: Diagnosis not present

## 2022-02-17 DIAGNOSIS — Z87891 Personal history of nicotine dependence: Secondary | ICD-10-CM | POA: Insufficient documentation

## 2022-02-17 DIAGNOSIS — Z9012 Acquired absence of left breast and nipple: Secondary | ICD-10-CM | POA: Diagnosis not present

## 2022-02-17 DIAGNOSIS — Z803 Family history of malignant neoplasm of breast: Secondary | ICD-10-CM | POA: Diagnosis not present

## 2022-02-17 DIAGNOSIS — Z7189 Other specified counseling: Secondary | ICD-10-CM | POA: Diagnosis not present

## 2022-02-17 DIAGNOSIS — Z800 Family history of malignant neoplasm of digestive organs: Secondary | ICD-10-CM | POA: Insufficient documentation

## 2022-02-17 DIAGNOSIS — Z90710 Acquired absence of both cervix and uterus: Secondary | ICD-10-CM | POA: Diagnosis not present

## 2022-02-17 DIAGNOSIS — I10 Essential (primary) hypertension: Secondary | ICD-10-CM | POA: Insufficient documentation

## 2022-02-17 DIAGNOSIS — D0512 Intraductal carcinoma in situ of left breast: Secondary | ICD-10-CM | POA: Insufficient documentation

## 2022-02-19 NOTE — Progress Notes (Signed)
Hematology/Oncology Consult note [facility]  [phone] [phone]  Patient Care Team: Latanya Maudlin, NP as PCP - General (Family Medicine) Theodore Demark, RN (Inactive) as Oncology Nurse Navigator   Name of the patient: Tammy Griffin  [id number]  1961-01-04   Date of visit: 02/19/22  Diagnosis-breast DCIS  Chief complaint/ Reason for visit-discuss final pathology results and further management  Heme/Onc history: Patient is a 61 year old female who underwent a screening bilateral mammogram in March 2023 which showed possible calcifications in the left breast.  This was followed by diagnostic mammogram and ultrasound which showed 8 mm group of calcifications in the left breast.  These were biopsied and was consistent with DCIS high-grade with comedonecrosis and calcifications.  Patient has had a prior right breast biopsy which was negative.   Patient decided to opt for bilateral mastectomy given strong family history of breast cancer.Final pathology showed no residual DCIS in the left breast.  No DCIS or invasive malignancy in the right side.  11 lymph nodes on the left negative for malignancy margins negative.  ER testing on original biopsy specimen was positive greater than 90%  Interval history-patient still has a left breast drain in place.  Overall she is healing well from her mastectomy.  ECOG PS- 1 Pain scale- 0   Review of systems- Review of Systems  Constitutional:  Negative for chills, fever, malaise/fatigue and weight loss.  HENT:  Negative for congestion, ear discharge and nosebleeds.   Eyes:  Negative for blurred vision.  Respiratory:  Negative for cough, hemoptysis, sputum production, shortness of breath and wheezing.   Cardiovascular:  Negative for chest pain, palpitations, orthopnea and claudication.  Gastrointestinal:  Negative for abdominal pain, blood in stool, constipation, diarrhea, heartburn, melena,  nausea and vomiting.  Genitourinary:  Negative for dysuria, flank pain, frequency, hematuria and urgency.  Musculoskeletal:  Negative for back pain, joint pain and myalgias.  Skin:  Negative for rash.  Neurological:  Negative for dizziness, tingling, focal weakness, seizures, weakness and headaches.  Endo/Heme/Allergies:  Does not bruise/bleed easily.  Psychiatric/Behavioral:  Negative for depression and suicidal ideas. The patient does not have insomnia.       Allergies  Allergen Reactions   Valium [Diazepam] Swelling    via IV     Past Medical History:  Diagnosis Date   Arthritis    Asthma    Breast cancer ([place]) 02/08/2022   Cancer ([place]) 12/15/2019   basal cell ca   Carpal tunnel syndrome, bilateral    Dyspnea    with exertion; patient states it is because she is overweight   GERD (gastroesophageal reflux disease)    History of hiatal hernia    Hypertension    Neuropathy    Sleep apnea    uses cpap     Past Surgical History:  Procedure Laterality Date   ABDOMINAL HYSTERECTOMY     bladder stretching     when she 55 -85 years old - bed wetting   BREAST BIOPSY Left 01/09/2022   Stereo Bx, Ribbon Clip, path pending   BREAST EXCISIONAL BIOPSY Right 03/01/2012   benign neoplast   BREAST SURGERY Right 03/01/2012   Lumpectomy   CARPAL TUNNEL RELEASE Right    COLONOSCOPY W/ POLYPECTOMY     INCONTINENCE SURGERY  2012   [place] ARTHROSCOPY WITH MEDIAL MENISECTOMY Right 08/29/2019   Procedure: Arthroscopic Partial MEDIAL MENISCECTOMY;  Surgeon: Leim Fabry, MD;  Location:  [place];  Service: Orthopedics;  Laterality: Right;   TOTAL HIP ARTHROPLASTY Right 07/18/2016   Procedure: TOTAL HIP ARTHROPLASTY ANTERIOR APPROACH;  Surgeon: Hessie Knows, MD;  Location: [facility];  Service: Orthopedics;  Laterality: Right;   TOTAL MASTECTOMY Bilateral 02/08/2022   Procedure: TOTAL MASTECTOMY- LEFT AXILLARY SENTINEL NODE BIOPSY;   Surgeon: Herbert Pun, MD;  Location: [facility];  Service: General;  Laterality: Bilateral;   WISDOM TOOTH EXTRACTION      Social History   Socioeconomic History   Marital status: Married    Spouse name: [name]   Number of children: 1   Years of education: Not on file   Highest education level: Not on file  Occupational History   Not on file  Tobacco Use   Smoking status: Former    Packs/day: 2.00    Types: Cigarettes    Quit date: 09/30/1997    Years since quitting: 24.4   Smokeless tobacco: Never  Substance and Sexual Activity   Alcohol use: No   Drug use: Not on file   Sexual activity: Not on file  Other Topics Concern   Not on file  Social History Narrative   Not on file   Social Determinants of Health   Financial Resource Strain: Not on file  Food Insecurity: Not on file  Transportation Needs: Not on file  Physical Activity: Not on file  Stress: Not on file  Social Connections: Not on file  Intimate Partner Violence: Not on file    Family History  Problem Relation Age of Onset   Breast cancer Mother 58   Diabetes Father    Hypertension Father    Prostate cancer Father        Market researcher; d. 45   Breast cancer Sister 67   Breast cancer Maternal Aunt        dx >50   Colon cancer Maternal Aunt        dx >50   Breast cancer Maternal Grandmother        dx 19s   Colon cancer Cousin        dx 31s     Current Outpatient Medications:    acetaminophen (TYLENOL) 500 MG tablet, Take 1,000 mg by mouth every 8 (eight) hours as needed., Disp: , Rfl:    amLODipine-olmesartan (AZOR) 10-40 MG tablet, Take 1 tablet by mouth daily., Disp: , Rfl:    celecoxib (CELEBREX) 200 MG capsule, Take 200 mg by mouth daily., Disp: , Rfl:    Cholecalciferol (VITAMIN D3) 250 MCG (10000 UT) capsule, Take 10,000 Units by mouth daily., Disp: , Rfl:    fluticasone furoate-vilanterol (BREO ELLIPTA) 100-25 MCG/ACT AEPB, Inhale 1 puff into the lungs daily., Disp: , Rfl:     methocarbamol (ROBAXIN) 500 MG tablet, Take 500 mg by mouth 4 (four) times daily. Pt not sure if dosage., Disp: , Rfl:    omeprazole (PRILOSEC) 20 MG capsule, Take 20 mg by mouth 2 (two) times daily before a meal., Disp: , Rfl:    terbinafine (LAMISIL) 250 MG tablet, Take 250 mg by mouth daily., Disp: , Rfl:    HYDROcodone-acetaminophen (NORCO) 5-325 MG tablet, Take 1-2 tablets by mouth every 4 (four) hours as needed for moderate pain or severe pain. (Patient not taking: Reported on 01/16/2022), Disp: 10 tablet, Rfl: 0   ondansetron (ZOFRAN ODT) 4 MG disintegrating tablet, Take 1 tablet (4 mg total) by mouth every 8 (eight) hours as needed for nausea or vomiting. (Patient not taking: Reported on  01/16/2022), Disp: 20 tablet, Rfl: 0   oxyCODONE-acetaminophen (PERCOCET) 5-325 MG tablet, Take 1 tablet by mouth every 4 (four) hours as needed for severe pain. (Patient not taking: Reported on 02/17/2022), Disp: 20 tablet, Rfl: 0  Physical exam:  Vitals:   02/17/22 1431  BP: (!) 121/58  Pulse: 72  Resp: 20  Temp: 98.1 F (36.7 C)  SpO2: 96%  Weight: 240 lb 14.4 oz (109.3 kg)   Physical Exam Constitutional:      General: She is not in acute distress. Cardiovascular:     Rate and Rhythm: Normal rate and regular rhythm.     Heart sounds: Normal heart sounds.  Pulmonary:     Effort: Pulmonary effort is normal.  Skin:    General: Skin is warm and dry.  Neurological:     Mental Status: She is alert and oriented to person, place, and time.        Latest Ref Rng & Units 02/09/2022    5:53 AM  CMP  Glucose 70 - 99 mg/dL 120    BUN 6 - 20 mg/dL 17    Creatinine 0.44 - 1.00 mg/dL 0.88    Sodium 135 - 145 mmol/L 139    Potassium 3.5 - 5.1 mmol/L 4.3    Chloride 98 - 111 mmol/L 106    CO2 22 - 32 mmol/L 26    Calcium 8.9 - 10.3 mg/dL 8.5        Latest Ref Rng & Units 02/09/2022    5:53 AM  CBC  WBC 4.0 - 10.5 K/uL 8.4    Hemoglobin 12.0 - 15.0 g/dL 11.7    Hematocrit 36.0 - 46.0 % 34.9     Platelets 150 - 400 K/uL 231      No images are attached to the encounter.  NM Sentinel Node Inj-No Rpt (Breast)  Result Date: 02/08/2022 Sulfur Colloid was injected by the Nuclear Medicine Technologist for sentinel lymph node localization.     Assessment and plan- Patient is a 61 y.o. 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  Given her strong family history of breast cancer she decided to opt for bilateral mastectomy which did not show any residual DCIS or invasive cancer.  There would be no role for adjuvant radiation at this time.  No role for adjuvant endocrine therapy for ER positive DCIS after bilateral mastectomy as risks outweigh the benefits.  Moving forward in terms of surveillance patient does not require any mammograms postmastectomy.  She would require yearly routine surveillance chest wall exams which can either be done by me or her PCP.  I will see her back in a year.  I will refer her to [facility] Fuse for lymphedema prevention exercises.  I will also reach out to Faith Rogue from genetic counseling as patient had some questions pertaining to her genetic testing   Visit Diagnosis 1. Ductal carcinoma in situ (DCIS) of left breast   2. Goals of care, counseling/discussion      Dr. Randa Evens, MD, MPH [facility] [phone] 02/19/2022 10:58 AM

## 2022-02-20 ENCOUNTER — Ambulatory Visit: Payer: BC Managed Care – PPO | Admitting: Oncology

## 2022-02-22 ENCOUNTER — Telehealth: Payer: Self-pay | Admitting: Licensed Clinical Social Worker

## 2022-02-22 NOTE — Telephone Encounter (Signed)
Revealed negative genetic testing.   This normal result is reassuring and indicates that it is unlikely Tammy Griffin's cancer is due to a hereditary cause.  It is unlikely that there is an increased risk of another cancer due to a mutation in one of these genes.  However, genetic testing is not perfect, and cannot definitively rule out a hereditary cause.  It will be important for her to keep in contact with genetics to learn if any additional testing may be needed in the future.

## 2022-02-23 ENCOUNTER — Ambulatory Visit: Payer: Self-pay | Admitting: Licensed Clinical Social Worker

## 2022-02-23 ENCOUNTER — Encounter: Payer: Self-pay | Admitting: Licensed Clinical Social Worker

## 2022-02-23 DIAGNOSIS — Z1379 Encounter for other screening for genetic and chromosomal anomalies: Secondary | ICD-10-CM

## 2022-02-23 NOTE — Progress Notes (Signed)
HPI:  Ms. Bubel was previously seen in the [place] clinic due to a personal and family history of cancer and concerns regarding a hereditary predisposition to cancer. Please refer to our prior cancer genetics clinic note for more information regarding our discussion, assessment and recommendations, at the time. Ms. Perno's recent genetic test results were disclosed to her, as were recommendations warranted by these results. These results and recommendations are discussed in more detail below.  CANCER HISTORY:  Oncology History  Breast neoplasm, Tis (DCIS), left  01/16/2022 Initial Diagnosis   Breast neoplasm, Tis (DCIS), left   01/16/2022 Cancer Staging   Staging form: Breast, AJCC 8th Edition - Clinical stage from 01/16/2022: Stage 0 (cTis (DCIS), cN0, cM0, G2, ER: Not Assessed, PR: Not Assessed, HER2: Not Assessed) - Signed by Sindy Guadeloupe, MD on 01/16/2022 Nuclear grade: G2 Histologic grading system: 3 grade system    Genetic Testing   Negative genetic testing. No pathogenic variants identified on the [facility] CancerNext-Expanded+RNA panel. The report date is 02/19/2022.  The CancerNext-Expanded + RNAinsight gene panel offered by [employer] and includes sequencing and rearrangement analysis for the following 77 genes: IP, ALK, APC*, ATM*, AXIN2, BAP1, BARD1, BLM, BMPR1A, BRCA1*, BRCA2*, BRIP1*, CDC73, CDH1*,CDK4, CDKN1B, CDKN2A, CHEK2*, CTNNA1, DICER1, FANCC, FH, FLCN, GALNT12, KIF1B, LZTR1, MAX, MEN1, MET, MLH1*, MSH2*, MSH3, MSH6*, MUTYH*, NBN, NF1*, NF2, NTHL1, PALB2*, PHOX2B, PMS2*, POT1, PRKAR1A, PTCH1, PTEN*, RAD51C*, RAD51D*,RB1, RECQL, RET, SDHA, SDHAF2, SDHB, SDHC, SDHD, SMAD4, SMARCA4, SMARCB1, SMARCE1, STK11, SUFU, TMEM127, TP53*,TSC1, TSC2, VHL and XRCC2 (sequencing and deletion/duplication); EGFR, EGLN1, HOXB13, KIT, MITF, PDGFRA, POLD1 and POLE (sequencing only); EPCAM and GREM1 (deletion/duplication only).     FAMILY HISTORY:  We obtained a detailed,  4-generation family history.  Significant diagnoses are listed below: Family History  Problem Relation Age of Onset   Breast cancer Mother 82   Diabetes Father    Hypertension Father    Prostate cancer Father        metatsatic; d. 63   Breast cancer Sister 38   Breast cancer Maternal Aunt        dx >50   Colon cancer Maternal Aunt        dx >50   Breast cancer Maternal Grandmother        dx 90s   Colon cancer Cousin        dx 46s   Ms. Coke has 2 daughters and 1 son. She has 1 sister, 1 brother. Her sister had breast cancer at 55 and had genetic testing at that time that was negative (2007).    Ms. Larocca's mother had breast cancer at 79 and passed at 50. Patient had 4 maternal aunts, 4 uncles. One aunt had breast cancer, one aunt had colon cancer, both diagnosed over 42. An uncle had a possible cancer, unknown type. Possible cousin had breast cancer. Cousin had colon cancer in his 58s. Maternal grandmother had breast cancer and passed in her 27s. Grandfather passed over 3.   Ms. Zaremba's father had prostate cancer that was metastatic, he passed at 88. His father had lung cancer over 55, no other known cancers on this side of the family.   Ms. Budden is unaware of previous family history of genetic testing for hereditary cancer risks.  There is no reported Ashkenazi Jewish ancestry. There is no known consanguinity.      GENETIC TEST RESULTS: Genetic testing reported out on 02/19/2022 through the [facility] CancerNext-Expanded+RNA cancer panel found no pathogenic mutations.  The CancerNext-Expanded + RNAinsight gene panel offered by [employer] and includes sequencing and rearrangement analysis for the following 77 genes: IP, ALK, APC*, ATM*, AXIN2, BAP1, BARD1, BLM, BMPR1A, BRCA1*, BRCA2*, BRIP1*, CDC73, CDH1*,CDK4, CDKN1B, CDKN2A, CHEK2*, CTNNA1, DICER1, FANCC, FH, FLCN, GALNT12, KIF1B, LZTR1, MAX, MEN1, MET, MLH1*, MSH2*, MSH3, MSH6*, MUTYH*, NBN, NF1*, NF2, NTHL1, PALB2*,  PHOX2B, PMS2*, POT1, PRKAR1A, PTCH1, PTEN*, RAD51C*, RAD51D*,RB1, RECQL, RET, SDHA, SDHAF2, SDHB, SDHC, SDHD, SMAD4, SMARCA4, SMARCB1, SMARCE1, STK11, SUFU, TMEM127, TP53*,TSC1, TSC2, VHL and XRCC2 (sequencing and deletion/duplication); EGFR, EGLN1, HOXB13, KIT, MITF, PDGFRA, POLD1 and POLE (sequencing only); EPCAM and GREM1 (deletion/duplication only).   The test report has been scanned into EPIC and is located under the Molecular Pathology section of the Results Review tab.  A portion of the result report is included below for reference.     We discussed that because current genetic testing is not perfect, it is possible there may be a gene mutation in one of these genes that current testing cannot detect, but that chance is small.  There could be another gene that has not yet been discovered, or that we have not yet tested, that is responsible for the cancer diagnoses in the family. It is also possible there is a hereditary cause for the cancer in the family that Ms. Hines did not inherit and therefore was not identified in her testing.  Therefore, it is important to remain in touch with cancer genetics in the future so that we can continue to offer Ms. Casco the most up to date genetic testing.   ADDITIONAL GENETIC TESTING: We discussed with Ms. Antu that her genetic testing was fairly extensive.  If there are genes identified to increase cancer risk that can be analyzed in the future, we would be happy to discuss and coordinate this testing at that time.    CANCER SCREENING RECOMMENDATIONS: Ms. Dechaine's test result is considered negative (normal).  This means that we have not identified a hereditary cause for her  personal and family history of cancer at this time. Most cancers happen by chance and this negative test suggests that her cancer may fall into this category.    While reassuring, this does not definitively rule out a hereditary predisposition to cancer. It is still possible  that there could be genetic mutations that are undetectable by current technology. There could be genetic mutations in genes that have not been tested or identified to increase cancer risk.  Therefore, it is recommended she continue to follow the cancer management and screening guidelines provided by her oncology and primary healthcare provider.   An individual's cancer risk and medical management are not determined by genetic test results alone. Overall cancer risk assessment incorporates additional factors, including personal medical history, family history, and any available genetic information that may result in a personalized plan for cancer prevention and surveillance.  RECOMMENDATIONS FOR FAMILY MEMBERS:  Relatives in this family might be at some increased risk of developing cancer, over the general population risk, simply due to the family history of cancer.  We recommended female relatives in this family have a yearly mammogram beginning at age 86, or 27 years younger than the earliest onset of cancer, an annual clinical breast exam, and perform monthly breast self-exams. Female relatives in this family should also have a gynecological exam as recommended by their primary provider.  All family members should be referred for colonoscopy starting at age 47.   It is also possible there is a hereditary cause  for the cancer in Ms. Shaub's family that she did not inherit and therefore was not identified in her.  Based on Ms. Barrilleaux's family history, we recommended maternal relatives have genetic counseling and testing. Ms. Lana will let us know if we can be of any assistance in coordinating genetic counseling and/or testing for these family members.  FOLLOW-UP: Lastly, we discussed with Ms. Meinders that cancer genetics is a rapidly advancing field and it is possible that new genetic tests will be appropriate for her and/or her family members in the future. We encouraged her to remain in contact  with cancer genetics on an annual basis so we can update her personal and family histories and let her know of advances in cancer genetics that may benefit this family.   Our contact number was provided. Ms. Dascenzo's questions were answered to her satisfaction, and she knows she is welcome to call us at anytime with additional questions or concerns.   Faith Rogue, MS, [facility] Genetic Counselor [email or web address] Phone: [phone]

## 2022-03-06 ENCOUNTER — Ambulatory Visit: Payer: BC Managed Care – PPO | Attending: Oncology | Admitting: Occupational Therapy

## 2022-03-06 ENCOUNTER — Encounter: Payer: Self-pay | Admitting: Occupational Therapy

## 2022-03-06 ENCOUNTER — Telehealth: Payer: Self-pay

## 2022-03-06 DIAGNOSIS — M25612 Stiffness of left shoulder, not elsewhere classified: Secondary | ICD-10-CM | POA: Diagnosis present

## 2022-03-06 DIAGNOSIS — M25611 Stiffness of right shoulder, not elsewhere classified: Secondary | ICD-10-CM | POA: Diagnosis present

## 2022-03-06 DIAGNOSIS — L905 Scar conditions and fibrosis of skin: Secondary | ICD-10-CM | POA: Insufficient documentation

## 2022-03-06 NOTE — Telephone Encounter (Signed)
Research nurse called and spoke with the patient this morning regarding eligibility clarification for the MTG-015 blood protocol. Research nurse questioned the patient as to whether she had a cancer removed from her right breast in 2013. The patient verified that she had a tumor removed from her right breast in 2013 due to the size that it was and how fast it had grown. Her surgeon at that time felt it was best to take it all out.  She states that this was not a cancer, it was more like a cyst only. She did not require any other treatment for this cyst after excision. 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 Phillip Heal in [place], [place]. This has not required any further treatment. Records requested from [facility] in [place] today for this skin cancer.   Jeral Fruit, RN 03/06/22 11:21 AM  Records received from [facility] and sent to be scanned in to the patients Epic chart.  Jeral Fruit, RN 03/06/22 11:24 AM

## 2022-03-06 NOTE — Therapy (Signed)
[facility] [address], [place], [address] Phone: [phone]   Fax:  [phone]  Occupational Therapy Evaluation  Patient Details  Name: Tammy Griffin MRN: [id number] Date of Birth: 05/16/1961 Referring Provider (OT): Dr Janese Banks   Encounter Date: 03/06/2022   OT End of Session - 03/06/22 1309     Visit Number 1    Number of Visits 4    Date for OT Re-Evaluation 04/10/22    OT Start Time 1115    OT Stop Time 1155    OT Time Calculation (min) 40 min    Activity Tolerance Patient tolerated treatment well    Behavior During Therapy [facility] for tasks assessed/performed             Past Medical History:  Diagnosis Date   Arthritis    Asthma    Breast cancer ([facility]) 02/08/2022   Cancer ([facility]) 12/15/2019   basal cell ca   Carpal tunnel syndrome, bilateral    Dyspnea    with exertion; patient states it is because she is overweight   GERD (gastroesophageal reflux disease)    History of hiatal hernia    Hypertension    Neuropathy    Sleep apnea    uses cpap    Past Surgical History:  Procedure Laterality Date   ABDOMINAL HYSTERECTOMY     bladder stretching     when she 61 -80 years old - bed wetting   BREAST BIOPSY Left 01/09/2022   Stereo Bx, Ribbon Clip, path pending   BREAST EXCISIONAL BIOPSY Right 03/01/2012   benign neoplast   BREAST SURGERY Right 03/01/2012   Lumpectomy   CARPAL TUNNEL RELEASE Right    COLONOSCOPY W/ POLYPECTOMY     INCONTINENCE SURGERY  2012   [place] ARTHROSCOPY WITH MEDIAL MENISECTOMY Right 08/29/2019   Procedure: Arthroscopic Partial MEDIAL MENISCECTOMY;  Surgeon: Leim Fabry, MD;  Location: [facility];  Service: Orthopedics;  Laterality: Right;   TOTAL HIP ARTHROPLASTY Right 07/18/2016   Procedure: TOTAL HIP ARTHROPLASTY ANTERIOR APPROACH;  Surgeon: Hessie Knows, MD;  Location: [facility];  Service: Orthopedics;   Laterality: Right;   TOTAL MASTECTOMY Bilateral 02/08/2022   Procedure: TOTAL MASTECTOMY- LEFT AXILLARY SENTINEL NODE BIOPSY;  Surgeon: Herbert Pun, MD;  Location: [facility];  Service: General;  Laterality: Bilateral;   WISDOM TOOTH EXTRACTION      There were no vitals filed for this visit.   Subjective Assessment - 03/06/22 1304     Subjective  I will be Wednesday 4 weeks post mastectomy.  Doing okay little stiff little discomfort with overhead motion.  If you can show me some exercises.  I do not need radiation and had 1 lymph node removed    Pertinent History Ductal carcinoma in situ (DCIS) of left breast (D05.12)  -S/p bilateral mastectomy 02/08/22  -Final pathology shows no residual DCIS  -Left axillary sentinel node negative for malignancy  -No malignancy or atypia identified on the right breast  -Medical oncology evaluation do not recommend any adjuvant radiation, antiestrogen or chemotherapy  -Surveillance will be with chest physical exam.  -Right chest drain removed today. Left chest without accumulation of fluid since drain removed last week.    PLAN:   1. Continue the great care to doing  2. I will follow-up wound check 03/06/22 with Dr Peyton Najjar - refer by Dr Janese Banks for lymphedema education and post mastectomy care  Patient Stated Goals If you can give me exercises I can get my motion better to do my work that I have to be able to pick up 30 pounds    Currently in Pain? Yes    Pain Score 2     Pain Location Chest    Pain Orientation Right;Left    Pain Descriptors / Indicators Discomfort    Pain Type Surgical pain    Pain Onset 1 to 4 weeks ago    Pain Frequency Occasional               [facility] OT Assessment - 03/06/22 0001       Assessment   Medical Diagnosis Bilateral mastectomies    Referring Provider (OT) Dr Janese Banks    Onset Date/Surgical Date 02/08/22    Hand Dominance Right    Next MD Visit Dr. Peyton Najjar today      Home  Environment   Lives With Family      Prior  Function   Vocation Full time employment    Leisure Return to work in July.  After be able to pick up 30 pounds of trace and push, has chickens and vegetables in the garden      AROM   Right Shoulder Extension 70 Degrees    Right Shoulder Flexion 145 Degrees    Right Shoulder ABduction 145 Degrees    Right Shoulder External Rotation 75 Degrees    Left Shoulder Extension 70 Degrees    Left Shoulder Flexion 145 Degrees    Left Shoulder ABduction 145 Degrees    Left Shoulder External Rotation 75 Degrees              LYMPHEDEMA/ONCOLOGY QUESTIONNAIRE - 03/06/22 0001       Type   Cancer Type Left breast cancer      Surgeries   Mastectomy Date 02/08/22    Number Lymph Nodes Removed 1      Treatment   Active Chemotherapy Treatment No    Past Radiation Treatment No      What other symptoms do you have   Are you Having Heaviness or Tightness No    Are you having Pain No    Do you have infections No      Right Upper Extremity Lymphedema   15 cm Proximal to Olecranon Process 37.2 cm    Olecranon Process 30 cm    15 cm Proximal to Ulnar Styloid Process 28 cm    Just Proximal to Ulnar Styloid Process 16 cm      Left Upper Extremity Lymphedema   15 cm Proximal to Olecranon Process 37 cm    Olecranon Process 30.5 cm    15 cm Proximal to Ulnar Styloid Process 27 cm    Just Proximal to Ulnar Styloid Process 16 cm               Handout provided and reviewed with patient about lymphedema signs and symptoms as well as precautions.  But patient's risk is very low because of only 1 lymph node removed as well as no chemo or radiation planned. Patient with bilateral shoulder decreased range of motion patient was educated in active assisted range 4-passive range of motion for shoulder flexion and abduction in supine using golf club 10 reps. As well as external rotation overhead in supine 10 reps Patient to stop with a slight pull with keeping pain under 2 out of 10 Followed by  active assisted range of motion on wall for shoulder  flexion and abduction 10 reps. Patient showed progress in session and all motion. Patient to call me if she needs follow-up or upgrade of home program for range of motion and strengthening.  Patient to see surgeon this afternoon            OT Education - 03/06/22 1309     Education Details Lymphedema education done but patient risk is low.  Home exercises for range of motion    Person(s) Educated Patient    Methods Explanation;Demonstration;Tactile cues;Verbal cues;Handout    Comprehension Verbal cues required;Returned demonstration;Verbalized understanding                 OT Long Term Goals - 03/06/22 1316       OT LONG TERM GOAL #1   Title Patient to be independent in home program to improve overhead active range of motion bilateral shoulders to be able to pick up and and reach into overhead cabinets pain-free    Baseline At rest patient feels discomfort in bilateral chest and axilla last with range of motion.  Patient mastectomy scars not ready for scar massage yet.  Patient following up with surgeon this afternoon.  Shoulder flexion at and abduction 145, external rotation 75 patient needed max assist and education for home program for range of motion5    Time 5    Period Weeks    Status New    Target Date 04/10/22                   Plan - 03/06/22 1312     Clinical Impression Statement Patient left breast cancer patient that present at OT evaluation 3-1/2 weeks postop bilateral mastectomy.  Patient referred for lymphedema education as well as range of motion.  Education.  Patient present with decreased overhead active range of motion for bilateral shoulder flexion, abduction and external rotation.  Patient only had 1 lymph node removed and no plans for radiation or chemo.  Patient risk for lymphedema is very low but handout was provided and reviewed with patient.  Bilateral upper extremity circumference  within normal limits and baseline taken.  Patient was educated in active assisted range of motion and passive range of motion for bilateral shoulders in all planes with keeping a pull below at 2/10.  Patient demo in session understanding as well as progress in overhead motion.  Patient do report that she has to be able to pick up and carry and push 30 pounds at work when she returns in July.  Patient can call me for a follow-up to review strengthening exercises if needed.  Education was also done for scar massage when okayed by surgeon, patient following up with her surgeon this afternoon.  Patient continue with home program and call me if needed for further strengthening or upgrade of home program.    OT Occupational Profile and History Problem Focused Assessment - Including review of records relating to presenting problem    Occupational performance deficits (Please refer to evaluation for details): ADL's;Work;Leisure;Play    Body Structure / Function / Physical Skills ADL;Strength;Pain;UE functional use;IADL;ROM;Scar mobility;Flexibility;Decreased knowledge of precautions    Rehab Potential Good    Clinical Decision Making Limited treatment options, no task modification necessary    Comorbidities Affecting Occupational Performance: None    Modification or Assistance to Complete Evaluation  No modification of tasks or assist necessary to complete eval    OT Frequency 1x / week   biweekly   OT Duration --   5  wks   OT Treatment/Interventions Self-care/ADL training;Therapeutic exercise;Scar mobilization;Passive range of motion;Patient/family education    Consulted and Agree with Plan of Care Patient             Patient will benefit from skilled therapeutic intervention in order to improve the following deficits and impairments:   Body Structure / Function / Physical Skills: ADL, Strength, Pain, UE functional use, IADL, ROM, Scar mobility, Flexibility, Decreased knowledge of precautions        Visit Diagnosis: Stiffness of left shoulder, not elsewhere classified - Plan: Ot plan of care cert/re-cert  Stiffness of right shoulder, not elsewhere classified - Plan: Ot plan of care cert/re-cert  Scar condition and fibrosis of skin - Plan: Ot plan of care cert/re-cert    Problem List Patient Active Problem List   Diagnosis Date Noted   Genetic testing 02/23/2022   Breast cancer ([facility]) 02/08/2022   Neuropathy 01/16/2022   Breast neoplasm, Tis (DCIS), left 01/16/2022   Primary localized osteoarthritis of right hip 07/18/2016    Rosalyn Gess, OTR/L,CLT 03/06/2022, 1:20 PM  [facility] [address] [place], [address] Phone: [phone]   Fax:  [phone]  Name: CARINA CHAPLIN MRN: [id number] Date of Birth: [birth year]

## 2022-06-15 ENCOUNTER — Other Ambulatory Visit: Payer: Self-pay

## 2022-08-23 IMAGING — MG MM BREAST BX W LOC DEV 1ST LESION IMAGE BX SPEC STEREO GUIDE*L*
7 of 12 series · 7 of 12 positions shown · non-contrast
Comparison: Previous exams.
COMPARISON: Previous exams.

Addendum:
CLINICAL DATA: Patient with suspicious calcifications in the
upper-outer quadrant of the LEFT breast presents today for
stereotactic biopsy.

EXAM:
LEFT BREAST STEREOTACTIC CORE NEEDLE BIOPSY

[L (1 of 7)]
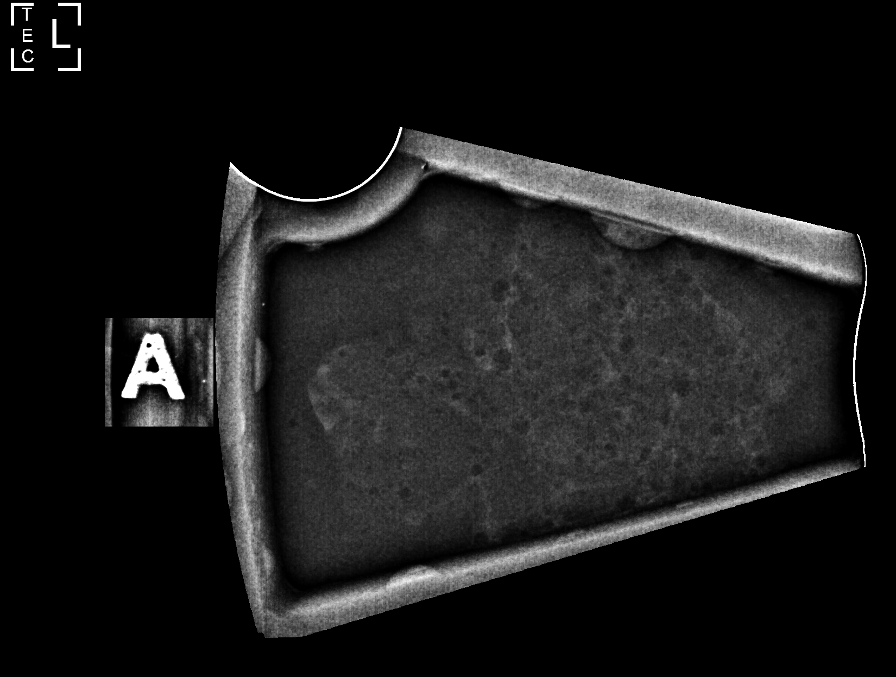

[L (2 of 7)]
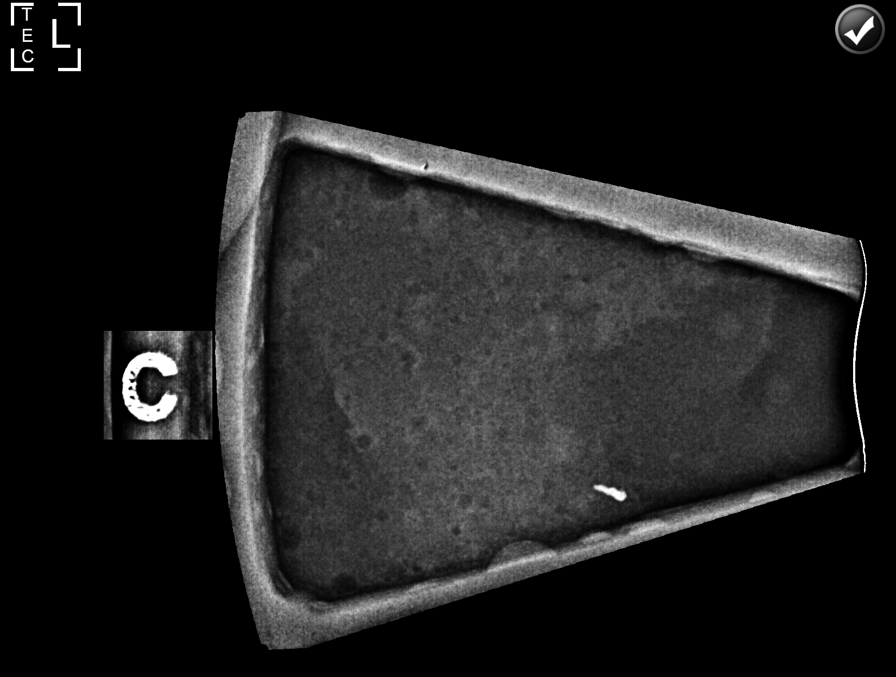

[L (3 of 7)]
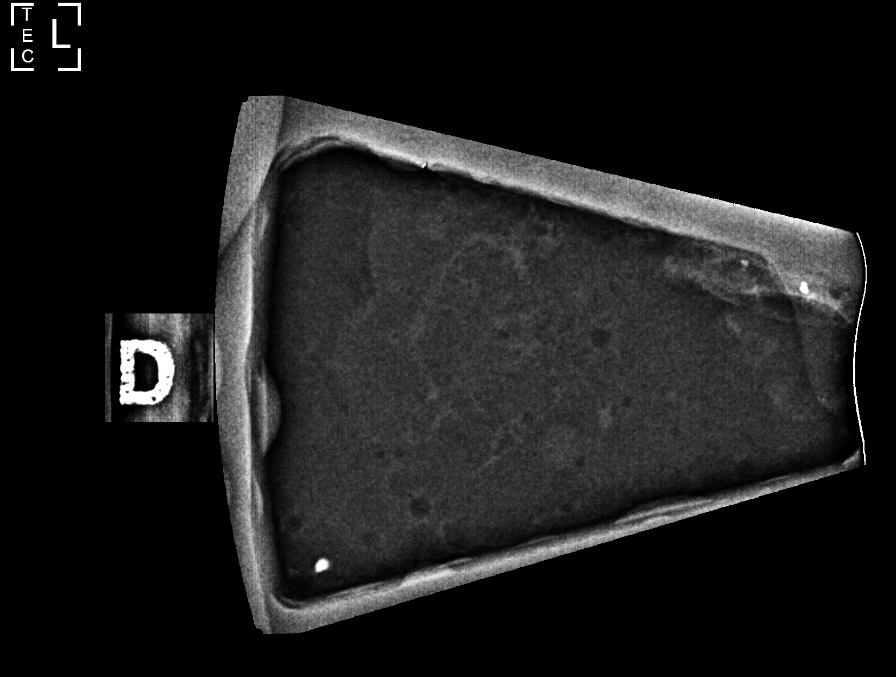

[L (4 of 7)]
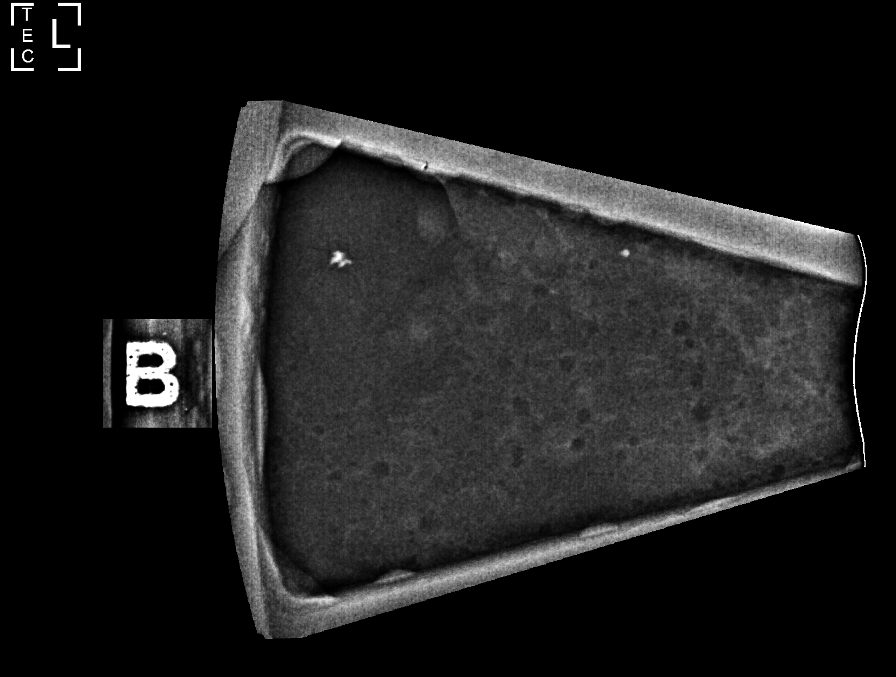

[L (5 of 7)]
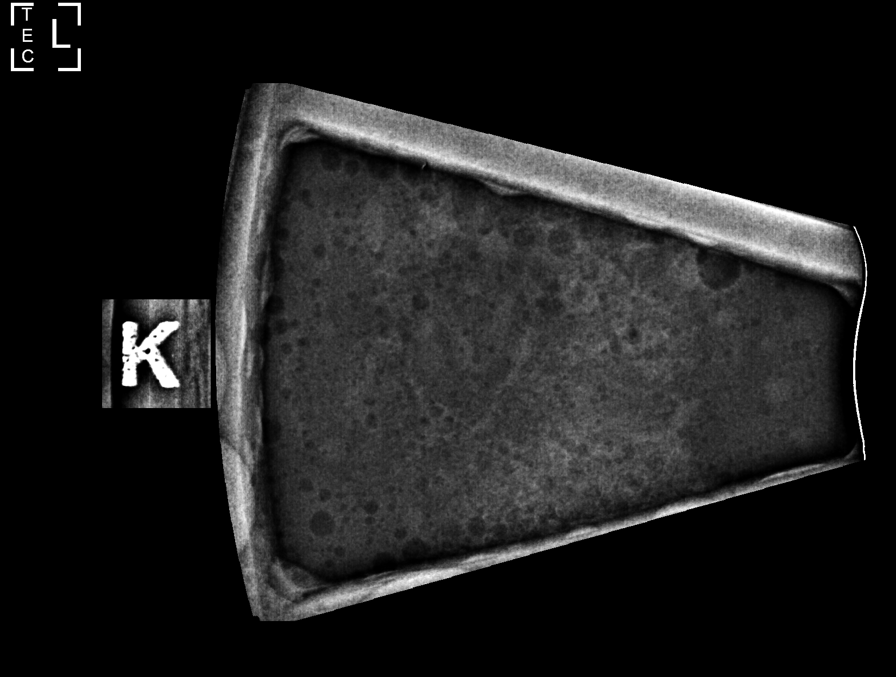

[L (6 of 7)]
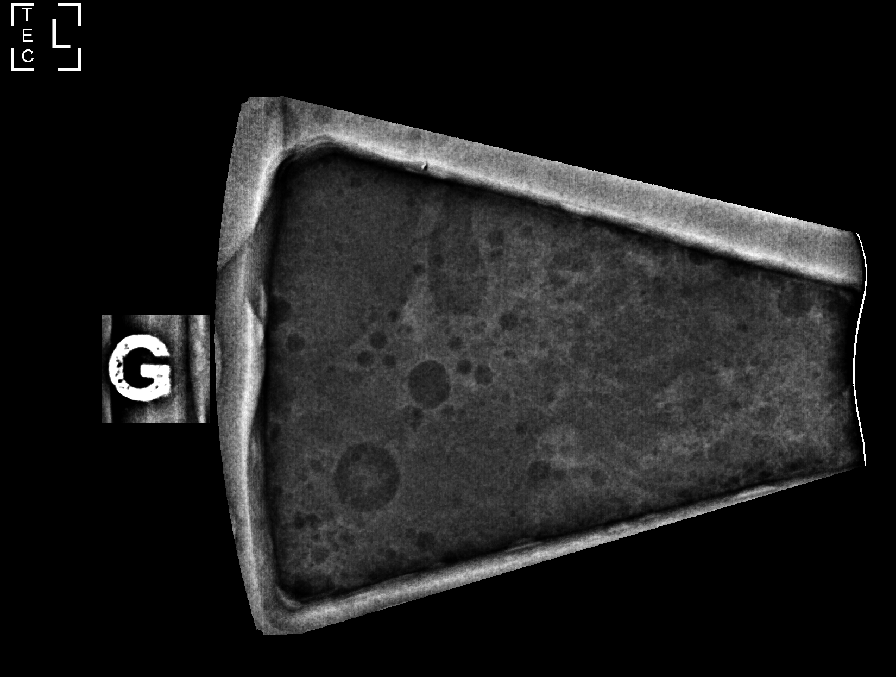

[L (7 of 7)]
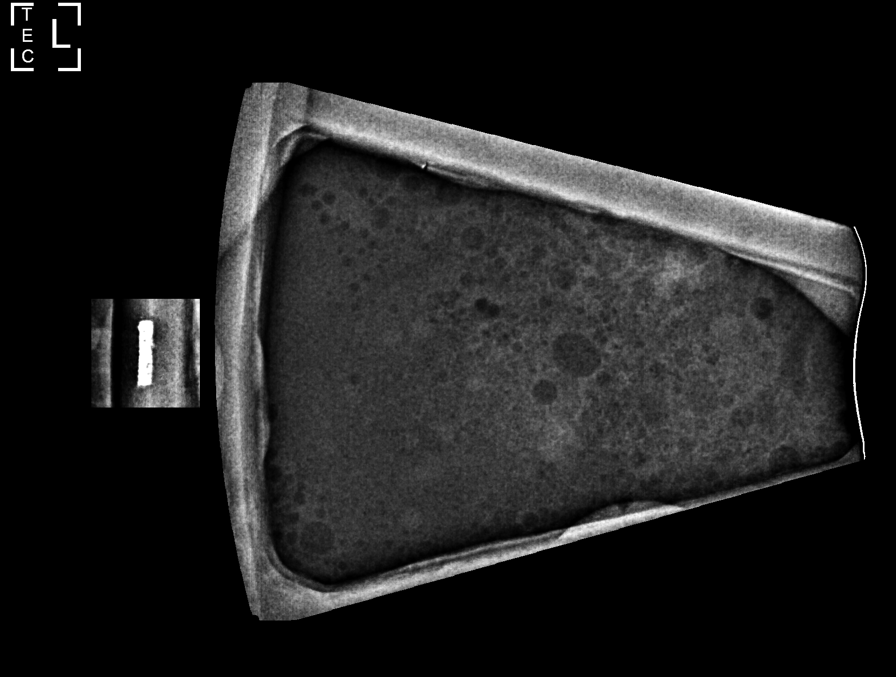

[7 of 12 positions shown; findings below may reference images not displayed]



Using sterile technique and 1% Lidocaine as local anesthetic, under
stereotactic guidance, a 9 gauge vacuum assisted device was used to
perform core needle biopsy of calcifications in the upper outer
quadrant of the left breast using a superior approach. Specimen
radiograph was performed showing calcifications. Specimens with
calcifications are identified for pathology.

Lesion quadrant: Upper-outer quadrant

At the conclusion of the procedure, ribbon shaped tissue marker clip
was deployed into the biopsy cavity. Follow-up 2-view mammogram was
performed and dictated separately.
IMPRESSION: Stereotactic-guided biopsy of suspicious calcifications within the
upper-outer quadrant of the LEFT breast. No apparent complications.

ADDENDUM:
PATHOLOGY revealed: A. BREAST, LEFT, UPPER OUTER;
STEREOTACTIC-GUIDED CORE BIOPSY: - DUCTAL CARCINOMA IN SITU (DCIS),
HIGH-GRADE, WITH COMEDONECROSIS AND CALCIFICATIONS. Comment: DCIS is
present in 3 of 8 blocks, correlating with calcifications on
specimen radiographs. DCIS measures up to 2 mm in this sample. No
invasive carcinoma is seen.

Pathology results are CONCORDANT with imaging findings, per Dr. Tiger
Tjiurisa Nguvauva.

Pathology results and recommendations were discussed with patient
via telephone on 01/10/2022. Patient reported biopsy site doing well
with no adverse symptoms, and only slight tenderness at the site.
Post biopsy care instructions were reviewed, questions were answered
and my direct phone number was provided. Patient was instructed to
call [HOSPITAL] for any additional questions or concerns
related to biopsy site.

RECOMMENDATIONS: 1. Surgical consultation. Request for surgical
consultation relayed to Enuar Sdvsf RN at [HOSPITAL] [REDACTED] by Nomasibulele Moatshe RN on 01/10/2022.

2. Consider bilateral breast MRI with and without contrast to
determine extent of disease given diagnosis of high grade DCIS.

3. Postprocedure mammogram shows the ribbon shaped clip to be
significantly displaced from the biopsy site. There are, however,
residual calcifications at the biopsy site for potential
preoperative localization if breast conservation surgery is desired.

Pathology results reported by Nomasibulele Moatshe RN on 01/10/2022.



Using sterile technique and 1% Lidocaine as local anesthetic, under
stereotactic guidance, a 9 gauge vacuum assisted device was used to
perform core needle biopsy of calcifications in the upper outer
quadrant of the left breast using a superior approach. Specimen
radiograph was performed showing calcifications. Specimens with
calcifications are identified for pathology.

Lesion quadrant: Upper-outer quadrant

At the conclusion of the procedure, ribbon shaped tissue marker clip
was deployed into the biopsy cavity. Follow-up 2-view mammogram was
performed and dictated separately.
IMPRESSION: Stereotactic-guided biopsy of suspicious calcifications within the
upper-outer quadrant of the LEFT breast. No apparent complications.

## 2022-12-19 ENCOUNTER — Ambulatory Visit (INDEPENDENT_AMBULATORY_CARE_PROVIDER_SITE_OTHER): Payer: BC Managed Care – PPO

## 2022-12-19 DIAGNOSIS — D128 Benign neoplasm of rectum: Secondary | ICD-10-CM | POA: Diagnosis not present

## 2022-12-19 DIAGNOSIS — Z86010 Personal history of colonic polyps: Secondary | ICD-10-CM | POA: Diagnosis present

## 2022-12-19 DIAGNOSIS — K640 First degree hemorrhoids: Secondary | ICD-10-CM | POA: Diagnosis not present

## 2022-12-19 DIAGNOSIS — K5730 Diverticulosis of large intestine without perforation or abscess without bleeding: Secondary | ICD-10-CM | POA: Diagnosis not present

## 2023-02-19 ENCOUNTER — Encounter: Payer: Self-pay | Admitting: Oncology

## 2023-02-19 ENCOUNTER — Inpatient Hospital Stay: Payer: BC Managed Care – PPO | Attending: Oncology | Admitting: Oncology

## 2023-02-19 VITALS — BP 142/76 | HR 79 | Temp 98.9°F | Resp 18 | Ht 67.0 in | Wt 252.8 lb

## 2023-02-19 DIAGNOSIS — Z800 Family history of malignant neoplasm of digestive organs: Secondary | ICD-10-CM | POA: Diagnosis not present

## 2023-02-19 DIAGNOSIS — Z08 Encounter for follow-up examination after completed treatment for malignant neoplasm: Secondary | ICD-10-CM | POA: Diagnosis not present

## 2023-02-19 DIAGNOSIS — Z86000 Personal history of in-situ neoplasm of breast: Secondary | ICD-10-CM | POA: Diagnosis present

## 2023-02-19 DIAGNOSIS — Z87891 Personal history of nicotine dependence: Secondary | ICD-10-CM | POA: Insufficient documentation

## 2023-02-19 DIAGNOSIS — Z90710 Acquired absence of both cervix and uterus: Secondary | ICD-10-CM | POA: Diagnosis not present

## 2023-02-19 DIAGNOSIS — Z8042 Family history of malignant neoplasm of prostate: Secondary | ICD-10-CM | POA: Insufficient documentation

## 2023-02-19 DIAGNOSIS — Z9013 Acquired absence of bilateral breasts and nipples: Secondary | ICD-10-CM | POA: Insufficient documentation

## 2023-02-19 DIAGNOSIS — D0512 Intraductal carcinoma in situ of left breast: Secondary | ICD-10-CM

## 2023-02-19 DIAGNOSIS — Z803 Family history of malignant neoplasm of breast: Secondary | ICD-10-CM | POA: Insufficient documentation

## 2023-02-19 NOTE — Research (Signed)
MTG-015 - Tissue and Bodily Fluids: Translational Medicine: Discovery and Evaluation of Biomarkers/Pharmacogenomics for the Diagnosis and Personalized Management of Patients    One Year Follow Up:   Patient in to the cancer center unaccompanied today for her one year follow up with Dr. Smith Robert. Research nurse met with the patient today to review any changes in diagnosis and her current status. States when she had her mastectomy there wasn't any residual cancer there. She's glad she had her mastectomy she states. She has had a pre-cancerous mole removed from the back of her left leg, but no further cancer at that site either.  Reports no other changes in her condition. $47 [employer] gift card provided to the patient for her participation in the study per protocol. Patient was informed she is now finished with the MTG protocol.   Chriss Driver, RN 02/19/23 3:07 PM

## 2023-02-19 NOTE — Progress Notes (Signed)
Hematology/Oncology Consult note [facility]  [phone] [phone]  Patient Care Team: Luciana Axe, NP as PCP - General (Family Medicine) Scarlett Presto, RN (Inactive) as Oncology Nurse Navigator   Name of the patient: Tammy Griffin  [id number]  1961-05-01   Date of visit: 02/19/23  Diagnosis-history of left breast DCIS s/p bilateral mastectomy  Chief complaint/ Reason for visit- Routine follow-up of DCIS  Heme/Onc history: Patient is a 62 year old female who underwent a screening bilateral mammogram in March 2023 which showed possible calcifications in the left breast.  This was followed by diagnostic mammogram and ultrasound which showed 8 mm group of calcifications in the left breast.  These were biopsied and was consistent with DCIS high-grade with comedonecrosis and calcifications.  Patient has had a prior right breast biopsy which was negative.   Patient decided to opt for bilateral mastectomy given strong family history of breast cancer.Final pathology showed no residual DCIS in the left breast.  No DCIS or invasive malignancy in the right side.  11 lymph nodes on the left negative for malignancy margins negative.  ER testing on original biopsy specimen was positive greater than 90%.  Patient did not require any adjuvant radiation or endocrine therapy following bilateral mastectomy    Interval history-reports occasional chest wall tightness especially after she gained weight.  Denies any difficulty with range of motion in her upper extremities  ECOG PS- 0 Pain scale- 0   Review of systems- Review of Systems  Constitutional:  Negative for chills, fever, malaise/fatigue and weight loss.  HENT:  Negative for congestion, ear discharge and nosebleeds.   Eyes:  Negative for blurred vision.  Respiratory:  Negative for cough, hemoptysis, sputum production, shortness of breath and wheezing.   Cardiovascular:  Negative for  chest pain, palpitations, orthopnea and claudication.  Gastrointestinal:  Negative for abdominal pain, blood in stool, constipation, diarrhea, heartburn, melena, nausea and vomiting.  Genitourinary:  Negative for dysuria, flank pain, frequency, hematuria and urgency.  Musculoskeletal:  Negative for back pain, joint pain and myalgias.  Skin:  Negative for rash.  Neurological:  Negative for dizziness, tingling, focal weakness, seizures, weakness and headaches.  Endo/Heme/Allergies:  Does not bruise/bleed easily.  Psychiatric/Behavioral:  Negative for depression and suicidal ideas. The patient does not have insomnia.       Allergies  Allergen Reactions   Valium [Diazepam] Swelling    via IV     Past Medical History:  Diagnosis Date   Arthritis    Asthma    Breast cancer ([facility]) 02/08/2022   Cancer ([facility]) 12/15/2019   basal cell ca   Carpal tunnel syndrome, bilateral    Dyspnea    with exertion; patient states it is because she is overweight   GERD (gastroesophageal reflux disease)    History of hiatal hernia    Hypertension    Neuropathy    Sleep apnea    uses cpap     Past Surgical History:  Procedure Laterality Date   ABDOMINAL HYSTERECTOMY     bladder stretching     when she 54 -39 years old - bed wetting   BREAST BIOPSY Left 01/09/2022   Stereo Bx, Ribbon Clip, path pending   BREAST EXCISIONAL BIOPSY Right 03/01/2012   benign neoplast   BREAST SURGERY Right 03/01/2012   Lumpectomy   CARPAL TUNNEL RELEASE Right    COLONOSCOPY W/ POLYPECTOMY     INCONTINENCE SURGERY  2012   [facility]  KNEE ARTHROSCOPY WITH MEDIAL MENISECTOMY Right 08/29/2019   Procedure: Arthroscopic Partial MEDIAL MENISCECTOMY;  Surgeon: Signa Kell, MD;  Location: [facility];  Service: Orthopedics;  Laterality: Right;   TOTAL HIP ARTHROPLASTY Right 07/18/2016   Procedure: TOTAL HIP ARTHROPLASTY ANTERIOR APPROACH;  Surgeon: Kennedy Bucker, MD;  Location:  [facility];  Service: Orthopedics;  Laterality: Right;   TOTAL MASTECTOMY Bilateral 02/08/2022   Procedure: TOTAL MASTECTOMY- LEFT AXILLARY SENTINEL NODE BIOPSY;  Surgeon: Carolan Shiver, MD;  Location: [facility];  Service: General;  Laterality: Bilateral;   WISDOM TOOTH EXTRACTION      Social History   Socioeconomic History   Marital status: Married    Spouse name: Onalee Hua   Number of children: 1   Years of education: Not on file   Highest education level: Not on file  Occupational History   Not on file  Tobacco Use   Smoking status: Former    Packs/day: 2    Types: Cigarettes    Quit date: 09/30/1997    Years since quitting: 25.4   Smokeless tobacco: Never  Substance and Sexual Activity   Alcohol use: No   Drug use: Not on file   Sexual activity: Not on file  Other Topics Concern   Not on file  Social History Narrative   Not on file   Social Determinants of Health   Financial Resource Strain: Not on file  Food Insecurity: Not on file  Transportation Needs: Not on file  Physical Activity: Not on file  Stress: Not on file  Social Connections: Not on file  Intimate Partner Violence: Not on file    Family History  Problem Relation Age of Onset   Breast cancer Mother 39   Diabetes Father    Hypertension Father    Prostate cancer Father        Horticulturist, commercial; d. 76   Breast cancer Sister 9   Breast cancer Maternal Aunt        dx >50   Colon cancer Maternal Aunt        dx >50   Breast cancer Maternal Grandmother        dx 32s   Colon cancer Cousin        dx 41s     Current Outpatient Medications:    acetaminophen (TYLENOL) 500 MG tablet, Take 1,000 mg by mouth every 8 (eight) hours as needed., Disp: , Rfl:    amLODipine-olmesartan (AZOR) 10-40 MG tablet, Take 1 tablet by mouth daily., Disp: , Rfl:    celecoxib (CELEBREX) 200 MG capsule, Take 200 mg by mouth daily., Disp: , Rfl:    Cholecalciferol (VITAMIN D3) 250 MCG (10000 UT) capsule, Take 10,000 Units by  mouth daily., Disp: , Rfl:    DULoxetine (CYMBALTA) 20 MG capsule, Take by mouth., Disp: , Rfl:    fluticasone furoate-vilanterol (BREO ELLIPTA) 100-25 MCG/ACT AEPB, Inhale 1 puff into the lungs daily., Disp: , Rfl:    methocarbamol (ROBAXIN) 500 MG tablet, Take 500 mg by mouth 4 (four) times daily. Pt not sure if dosage., Disp: , Rfl:    omeprazole (PRILOSEC) 20 MG capsule, Take 20 mg by mouth 2 (two) times daily before a meal., Disp: , Rfl:    PAXLOVID, 300/100, 20 x 150 MG & 10 x 100MG  TBPK, Take 1 tablet by mouth 2 (two) times daily., Disp: , Rfl:    terbinafine (LAMISIL) 250 MG tablet, Take 250 mg by mouth daily., Disp: , Rfl:    HYDROcodone-acetaminophen (NORCO) 5-325 MG  tablet, Take 1-2 tablets by mouth every 4 (four) hours as needed for moderate pain or severe pain. (Patient not taking: Reported on 01/16/2022), Disp: 10 tablet, Rfl: 0   ondansetron (ZOFRAN ODT) 4 MG disintegrating tablet, Take 1 tablet (4 mg total) by mouth every 8 (eight) hours as needed for nausea or vomiting. (Patient not taking: Reported on 01/16/2022), Disp: 20 tablet, Rfl: 0  Physical exam:  Vitals:   02/19/23 1412  BP: (!) 142/76  Pulse: 79  Resp: 18  Temp: 98.9 F (37.2 C)  TempSrc: Tympanic  SpO2: 99%  Weight: 252 lb 12.8 oz (114.7 kg)  Height: 5\' 7"  (1.702 m)   Physical Exam Cardiovascular:     Rate and Rhythm: Normal rate and regular rhythm.     Heart sounds: Normal heart sounds.  Pulmonary:     Effort: Pulmonary effort is normal.     Breath sounds: Normal breath sounds.  Skin:    General: Skin is warm and dry.  Neurological:     Mental Status: She is alert and oriented to person, place, and time.   Chest wall exam: Patient is s/p bilateral mastectomy with a well-healed surgical scar.  No evidence of chest wall recurrence.  No palpable bilateral axillary adenopathy.     Latest Ref Rng & Units 02/09/2022    5:53 AM  CMP  Glucose 70 - 99 mg/dL 409   BUN 6 - 20 mg/dL 17   Creatinine 8.11 - 1.00  mg/dL 9.14   Sodium 782 - 956 mmol/L 139   Potassium 3.5 - 5.1 mmol/L 4.3   Chloride 98 - 111 mmol/L 106   CO2 22 - 32 mmol/L 26   Calcium 8.9 - 10.3 mg/dL 8.5       Latest Ref Rng & Units 02/09/2022    5:53 AM  CBC  WBC 4.0 - 10.5 K/uL 8.4   Hemoglobin 12.0 - 15.0 g/dL 21.3   Hematocrit 08.6 - 46.0 % 34.9   Platelets 150 - 400 K/uL 231      Assessment and plan- Patient is a 62 y.o. female with history of left breast DCIS ER positive s/p bilateral mastectomy here for routine follow-up  Clinically patient is doing well and there is no evidence of chest wall recurrence.  Bilateral mastectomy is considered curative after DCIS and patient does not require any surveillance visits with oncology at this time.  No role for surveillance mammogram.  She can continue to follow-up with her primary care provider   Visit Diagnosis 1. Encounter for follow-up surveillance of ductal carcinoma in situ (DCIS) of breast      Dr. Owens Shark, MD, MPH [facility] [phone] 02/19/2023 3:31 PM
